# Patient Record
Sex: Female | Born: 1965 | Race: Black or African American | Hispanic: No | Smoking: Never smoker
Health system: Southern US, Community
[De-identification: ages and names within clinical notes are randomized; demographics above are authoritative.]

## PROBLEM LIST (undated history)

## (undated) DIAGNOSIS — N760 Acute vaginitis: Secondary | ICD-10-CM

## (undated) DIAGNOSIS — N83209 Unspecified ovarian cyst, unspecified side: Secondary | ICD-10-CM

## (undated) DIAGNOSIS — E611 Iron deficiency: Secondary | ICD-10-CM

## (undated) DIAGNOSIS — K219 Gastro-esophageal reflux disease without esophagitis: Secondary | ICD-10-CM

## (undated) DIAGNOSIS — B009 Herpesviral infection, unspecified: Secondary | ICD-10-CM

## (undated) DIAGNOSIS — I8390 Asymptomatic varicose veins of unspecified lower extremity: Secondary | ICD-10-CM

## (undated) DIAGNOSIS — B977 Papillomavirus as the cause of diseases classified elsewhere: Secondary | ICD-10-CM

## (undated) HISTORY — DX: Asymptomatic varicose veins of unspecified lower extremity: I83.90

## (undated) HISTORY — DX: Unspecified ovarian cyst, unspecified side: N83.209

## (undated) HISTORY — DX: Acute vaginitis: N76.0

## (undated) HISTORY — DX: Gastro-esophageal reflux disease without esophagitis: K21.9

## (undated) HISTORY — DX: Iron deficiency: E61.1

## (undated) HISTORY — PX: DILATION AND CURETTAGE OF UTERUS: SHX78

## (undated) HISTORY — DX: Papillomavirus as the cause of diseases classified elsewhere: B97.7

## (undated) HISTORY — DX: Herpesviral infection, unspecified: B00.9

---

## 1998-02-21 ENCOUNTER — Other Ambulatory Visit: Admission: RE | Admit: 1998-02-21 | Discharge: 1998-02-21 | Payer: Self-pay | Admitting: Obstetrics & Gynecology

## 1998-02-21 ENCOUNTER — Encounter: Admission: RE | Admit: 1998-02-21 | Discharge: 1998-02-21 | Payer: Self-pay | Admitting: Obstetrics & Gynecology

## 1998-02-28 ENCOUNTER — Encounter: Admission: RE | Admit: 1998-02-28 | Discharge: 1998-02-28 | Payer: Self-pay | Admitting: Obstetrics & Gynecology

## 1998-03-06 ENCOUNTER — Ambulatory Visit (HOSPITAL_COMMUNITY): Admission: RE | Admit: 1998-03-06 | Discharge: 1998-03-06 | Payer: Self-pay | Admitting: Obstetrics & Gynecology

## 1998-04-08 ENCOUNTER — Other Ambulatory Visit: Admission: RE | Admit: 1998-04-08 | Discharge: 1998-04-08 | Payer: Self-pay | Admitting: Obstetrics & Gynecology

## 1998-04-08 ENCOUNTER — Encounter: Admission: RE | Admit: 1998-04-08 | Discharge: 1998-04-08 | Payer: Self-pay | Admitting: Obstetrics & Gynecology

## 1998-04-10 ENCOUNTER — Ambulatory Visit (HOSPITAL_COMMUNITY): Admission: RE | Admit: 1998-04-10 | Discharge: 1998-04-10 | Payer: Self-pay | Admitting: Obstetrics & Gynecology

## 1998-04-18 ENCOUNTER — Inpatient Hospital Stay (HOSPITAL_COMMUNITY): Admission: AD | Admit: 1998-04-18 | Discharge: 1998-04-18 | Payer: Self-pay | Admitting: Obstetrics & Gynecology

## 1998-04-26 ENCOUNTER — Inpatient Hospital Stay (HOSPITAL_COMMUNITY): Admission: AD | Admit: 1998-04-26 | Discharge: 1998-04-26 | Payer: Self-pay | Admitting: *Deleted

## 1998-05-30 ENCOUNTER — Other Ambulatory Visit: Admission: RE | Admit: 1998-05-30 | Discharge: 1998-05-30 | Payer: Self-pay | Admitting: Obstetrics and Gynecology

## 1998-08-20 ENCOUNTER — Inpatient Hospital Stay (HOSPITAL_COMMUNITY): Admission: AD | Admit: 1998-08-20 | Discharge: 1998-08-20 | Payer: Self-pay | Admitting: *Deleted

## 1998-11-12 ENCOUNTER — Inpatient Hospital Stay (HOSPITAL_COMMUNITY): Admission: AD | Admit: 1998-11-12 | Discharge: 1998-11-14 | Payer: Self-pay | Admitting: Obstetrics and Gynecology

## 1999-03-18 ENCOUNTER — Encounter: Admission: RE | Admit: 1999-03-18 | Discharge: 1999-03-18 | Payer: Self-pay | Admitting: Internal Medicine

## 1999-06-01 ENCOUNTER — Inpatient Hospital Stay (HOSPITAL_COMMUNITY): Admission: AD | Admit: 1999-06-01 | Discharge: 1999-06-01 | Payer: Self-pay | Admitting: Obstetrics & Gynecology

## 1999-06-15 ENCOUNTER — Emergency Department (HOSPITAL_COMMUNITY): Admission: EM | Admit: 1999-06-15 | Discharge: 1999-06-16 | Payer: Self-pay | Admitting: Emergency Medicine

## 1999-07-23 ENCOUNTER — Encounter: Admission: RE | Admit: 1999-07-23 | Discharge: 1999-07-23 | Payer: Self-pay | Admitting: Obstetrics

## 1999-12-22 ENCOUNTER — Inpatient Hospital Stay (HOSPITAL_COMMUNITY): Admission: AD | Admit: 1999-12-22 | Discharge: 1999-12-22 | Payer: Self-pay | Admitting: Obstetrics

## 1999-12-24 ENCOUNTER — Ambulatory Visit (HOSPITAL_COMMUNITY): Admission: RE | Admit: 1999-12-24 | Discharge: 1999-12-24 | Payer: Self-pay | Admitting: Internal Medicine

## 1999-12-24 ENCOUNTER — Encounter: Admission: RE | Admit: 1999-12-24 | Discharge: 1999-12-24 | Payer: Self-pay | Admitting: Obstetrics

## 2000-02-01 ENCOUNTER — Ambulatory Visit (HOSPITAL_COMMUNITY): Admission: RE | Admit: 2000-02-01 | Discharge: 2000-02-01 | Payer: Self-pay | Admitting: Obstetrics

## 2000-02-10 ENCOUNTER — Encounter: Admission: RE | Admit: 2000-02-10 | Discharge: 2000-02-10 | Payer: Self-pay | Admitting: Internal Medicine

## 2000-08-23 ENCOUNTER — Encounter: Admission: RE | Admit: 2000-08-23 | Discharge: 2000-08-23 | Payer: Self-pay | Admitting: Hematology and Oncology

## 2000-11-04 ENCOUNTER — Encounter: Admission: RE | Admit: 2000-11-04 | Discharge: 2000-11-04 | Payer: Self-pay | Admitting: Obstetrics & Gynecology

## 2000-11-09 ENCOUNTER — Inpatient Hospital Stay (HOSPITAL_COMMUNITY): Admission: AD | Admit: 2000-11-09 | Discharge: 2000-11-09 | Payer: Self-pay | Admitting: Obstetrics

## 2000-11-09 ENCOUNTER — Encounter: Payer: Self-pay | Admitting: Obstetrics

## 2000-11-16 ENCOUNTER — Encounter: Payer: Self-pay | Admitting: *Deleted

## 2000-11-16 ENCOUNTER — Ambulatory Visit (HOSPITAL_COMMUNITY): Admission: RE | Admit: 2000-11-16 | Discharge: 2000-11-16 | Payer: Self-pay | Admitting: *Deleted

## 2000-11-17 ENCOUNTER — Ambulatory Visit (HOSPITAL_COMMUNITY): Admission: RE | Admit: 2000-11-17 | Discharge: 2000-11-17 | Payer: Self-pay | Admitting: *Deleted

## 2000-11-17 ENCOUNTER — Encounter (INDEPENDENT_AMBULATORY_CARE_PROVIDER_SITE_OTHER): Payer: Self-pay | Admitting: Specialist

## 2000-12-22 ENCOUNTER — Other Ambulatory Visit: Admission: RE | Admit: 2000-12-22 | Discharge: 2000-12-22 | Payer: Self-pay | Admitting: Obstetrics & Gynecology

## 2000-12-23 ENCOUNTER — Encounter: Admission: RE | Admit: 2000-12-23 | Discharge: 2000-12-23 | Payer: Self-pay | Admitting: Obstetrics & Gynecology

## 2001-04-25 ENCOUNTER — Encounter: Admission: RE | Admit: 2001-04-25 | Discharge: 2001-04-25 | Payer: Self-pay | Admitting: Internal Medicine

## 2001-04-25 ENCOUNTER — Encounter: Payer: Self-pay | Admitting: Internal Medicine

## 2001-04-25 ENCOUNTER — Ambulatory Visit (HOSPITAL_COMMUNITY): Admission: RE | Admit: 2001-04-25 | Discharge: 2001-04-25 | Payer: Self-pay | Admitting: Internal Medicine

## 2001-05-30 ENCOUNTER — Encounter: Admission: RE | Admit: 2001-05-30 | Discharge: 2001-05-30 | Payer: Self-pay | Admitting: Obstetrics & Gynecology

## 2001-06-02 ENCOUNTER — Encounter: Admission: RE | Admit: 2001-06-02 | Discharge: 2001-06-02 | Payer: Self-pay | Admitting: Obstetrics & Gynecology

## 2001-06-02 ENCOUNTER — Encounter: Payer: Self-pay | Admitting: Obstetrics & Gynecology

## 2001-06-30 ENCOUNTER — Encounter: Admission: RE | Admit: 2001-06-30 | Discharge: 2001-06-30 | Payer: Self-pay | Admitting: Internal Medicine

## 2001-07-01 ENCOUNTER — Encounter: Payer: Self-pay | Admitting: *Deleted

## 2001-07-01 ENCOUNTER — Inpatient Hospital Stay (HOSPITAL_COMMUNITY): Admission: AD | Admit: 2001-07-01 | Discharge: 2001-07-01 | Payer: Self-pay | Admitting: *Deleted

## 2001-08-01 ENCOUNTER — Encounter: Admission: RE | Admit: 2001-08-01 | Discharge: 2001-08-01 | Payer: Self-pay | Admitting: Obstetrics & Gynecology

## 2001-12-22 ENCOUNTER — Encounter: Admission: RE | Admit: 2001-12-22 | Discharge: 2001-12-22 | Payer: Self-pay | Admitting: Internal Medicine

## 2002-01-18 ENCOUNTER — Encounter (INDEPENDENT_AMBULATORY_CARE_PROVIDER_SITE_OTHER): Payer: Self-pay | Admitting: Specialist

## 2002-01-18 ENCOUNTER — Encounter: Admission: RE | Admit: 2002-01-18 | Discharge: 2002-01-18 | Payer: Self-pay | Admitting: *Deleted

## 2002-02-28 ENCOUNTER — Other Ambulatory Visit: Admission: RE | Admit: 2002-02-28 | Discharge: 2002-02-28 | Payer: Self-pay | Admitting: Obstetrics and Gynecology

## 2002-03-30 ENCOUNTER — Other Ambulatory Visit: Admission: RE | Admit: 2002-03-30 | Discharge: 2002-03-30 | Payer: Self-pay | Admitting: Obstetrics and Gynecology

## 2002-07-17 ENCOUNTER — Other Ambulatory Visit: Admission: RE | Admit: 2002-07-17 | Discharge: 2002-07-17 | Payer: Self-pay | Admitting: Obstetrics and Gynecology

## 2003-01-21 ENCOUNTER — Other Ambulatory Visit: Admission: RE | Admit: 2003-01-21 | Discharge: 2003-01-21 | Payer: Self-pay | Admitting: Obstetrics and Gynecology

## 2004-02-25 ENCOUNTER — Other Ambulatory Visit: Admission: RE | Admit: 2004-02-25 | Discharge: 2004-02-25 | Payer: Self-pay | Admitting: Obstetrics and Gynecology

## 2004-05-28 ENCOUNTER — Ambulatory Visit (HOSPITAL_COMMUNITY): Admission: RE | Admit: 2004-05-28 | Discharge: 2004-05-28 | Payer: Self-pay | Admitting: Obstetrics and Gynecology

## 2004-08-18 ENCOUNTER — Emergency Department (HOSPITAL_COMMUNITY): Admission: EM | Admit: 2004-08-18 | Discharge: 2004-08-18 | Payer: Self-pay | Admitting: Family Medicine

## 2004-10-21 ENCOUNTER — Inpatient Hospital Stay (HOSPITAL_COMMUNITY): Admission: AD | Admit: 2004-10-21 | Discharge: 2004-10-25 | Payer: Self-pay | Admitting: Obstetrics and Gynecology

## 2004-10-22 ENCOUNTER — Encounter (INDEPENDENT_AMBULATORY_CARE_PROVIDER_SITE_OTHER): Payer: Self-pay | Admitting: *Deleted

## 2006-11-29 ENCOUNTER — Encounter: Admission: RE | Admit: 2006-11-29 | Discharge: 2006-11-29 | Payer: Self-pay | Admitting: Obstetrics and Gynecology

## 2007-07-18 ENCOUNTER — Emergency Department (HOSPITAL_COMMUNITY): Admission: EM | Admit: 2007-07-18 | Discharge: 2007-07-18 | Payer: Self-pay | Admitting: Emergency Medicine

## 2008-08-01 ENCOUNTER — Encounter: Admission: RE | Admit: 2008-08-01 | Discharge: 2008-08-01 | Payer: Self-pay | Admitting: Obstetrics and Gynecology

## 2009-08-04 ENCOUNTER — Encounter: Admission: RE | Admit: 2009-08-04 | Discharge: 2009-08-04 | Payer: Self-pay | Admitting: Obstetrics and Gynecology

## 2009-09-24 ENCOUNTER — Ambulatory Visit: Payer: Self-pay | Admitting: Family Medicine

## 2009-09-24 DIAGNOSIS — M545 Low back pain, unspecified: Secondary | ICD-10-CM | POA: Insufficient documentation

## 2009-09-24 DIAGNOSIS — M79609 Pain in unspecified limb: Secondary | ICD-10-CM

## 2009-09-24 DIAGNOSIS — Z9189 Other specified personal risk factors, not elsewhere classified: Secondary | ICD-10-CM

## 2009-09-24 DIAGNOSIS — J45909 Unspecified asthma, uncomplicated: Secondary | ICD-10-CM

## 2009-09-24 DIAGNOSIS — R635 Abnormal weight gain: Secondary | ICD-10-CM

## 2009-09-26 ENCOUNTER — Encounter: Payer: Self-pay | Admitting: Family Medicine

## 2009-09-26 ENCOUNTER — Ambulatory Visit: Payer: Self-pay | Admitting: Family Medicine

## 2009-09-26 LAB — CONVERTED CEMR LAB
CO2: 25 meq/L (ref 19–32)
Calcium: 9.9 mg/dL (ref 8.4–10.5)
Glucose, Bld: 98 mg/dL (ref 70–99)
HCT: 38.8 % (ref 36.0–46.0)
MCV: 81 fL (ref 78.0–100.0)
RBC: 4.79 M/uL (ref 3.87–5.11)
Sodium: 138 meq/L (ref 135–145)
TSH: 2.158 microintl units/mL (ref 0.350–4.500)
Total CHOL/HDL Ratio: 4.8
WBC: 5.8 10*3/uL (ref 4.0–10.5)

## 2009-09-30 ENCOUNTER — Encounter: Payer: Self-pay | Admitting: Family Medicine

## 2009-10-07 ENCOUNTER — Telehealth: Payer: Self-pay | Admitting: *Deleted

## 2009-11-12 ENCOUNTER — Ambulatory Visit: Payer: Self-pay | Admitting: Family Medicine

## 2009-11-12 DIAGNOSIS — F4323 Adjustment disorder with mixed anxiety and depressed mood: Secondary | ICD-10-CM

## 2009-11-28 ENCOUNTER — Ambulatory Visit: Payer: Self-pay | Admitting: Family Medicine

## 2009-11-28 DIAGNOSIS — M25559 Pain in unspecified hip: Secondary | ICD-10-CM

## 2009-12-02 ENCOUNTER — Encounter: Admission: RE | Admit: 2009-12-02 | Discharge: 2009-12-02 | Payer: Self-pay | Admitting: Family Medicine

## 2009-12-04 ENCOUNTER — Telehealth: Payer: Self-pay | Admitting: Family Medicine

## 2009-12-04 ENCOUNTER — Encounter: Payer: Self-pay | Admitting: Family Medicine

## 2010-01-09 ENCOUNTER — Ambulatory Visit: Payer: Self-pay | Admitting: Family Medicine

## 2010-01-09 DIAGNOSIS — L680 Hirsutism: Secondary | ICD-10-CM

## 2010-01-09 DIAGNOSIS — N926 Irregular menstruation, unspecified: Secondary | ICD-10-CM | POA: Insufficient documentation

## 2010-01-09 DIAGNOSIS — M722 Plantar fascial fibromatosis: Secondary | ICD-10-CM | POA: Insufficient documentation

## 2010-01-09 LAB — CONVERTED CEMR LAB
ALT: 25 units/L (ref 0–35)
AST: 18 units/L (ref 0–37)
Alkaline Phosphatase: 89 units/L (ref 39–117)
Calcium: 9.1 mg/dL (ref 8.4–10.5)
Chloride: 103 meq/L (ref 96–112)
Creatinine, Ser: 0.86 mg/dL (ref 0.40–1.20)
Preg, Serum: NEGATIVE
Total Bilirubin: 0.2 mg/dL — ABNORMAL LOW (ref 0.3–1.2)

## 2010-01-14 ENCOUNTER — Encounter: Payer: Self-pay | Admitting: Family Medicine

## 2010-07-09 ENCOUNTER — Telehealth: Payer: Self-pay | Admitting: *Deleted

## 2010-09-04 ENCOUNTER — Encounter: Admission: RE | Admit: 2010-09-04 | Discharge: 2010-09-04 | Payer: Self-pay | Admitting: Obstetrics and Gynecology

## 2010-09-04 ENCOUNTER — Emergency Department (HOSPITAL_COMMUNITY)
Admission: EM | Admit: 2010-09-04 | Discharge: 2010-09-04 | Payer: Self-pay | Source: Home / Self Care | Admitting: Family Medicine

## 2010-11-01 ENCOUNTER — Encounter: Payer: Self-pay | Admitting: Obstetrics and Gynecology

## 2010-11-12 NOTE — Progress Notes (Signed)
  Phone Note Outgoing Call   Call placed by: Paula Compton MD,  December 04, 2009 3:35 PM Call placed to: Patient Summary of Call: Called patient, left voice mail message.  I wish to report negative x-rays of hips and lower back.  Left voice mail for patient to call me.  Will send letter with instructions for pyriformis exercises that I wish for her to do. Initial call taken by: Paula Compton MD,  December 04, 2009 3:37 PM

## 2010-11-12 NOTE — Letter (Signed)
Summary: Generic Letter  [facility]  [address]   [place], [place] [address]   Phone: [phone]  Fax: [phone]    12/04/2009  AZLEE MONFORTE [address] [place], [place]  [address]  Dear [name],    I hope this letter finds you well.  I write with good news, the x-rays of your hips and low back are normal.  I would like for you to try an exercise/stretching program to see if it helps the right-sided hip pain.  I am enclosing a sheet of paper that gives details about the way to do the hip exercises.       Sincerely,   Paula Compton MD  Appended Document: Generic Letter mailed with exercise instructions.

## 2010-11-12 NOTE — Assessment & Plan Note (Signed)
Summary: f/u tcb   Vital Signs:  Patient profile:   45 year old female Height:      66.0 inches Weight:      195 pounds Temp:     98.5 degrees F oral Pulse rate:   96 / minute BP sitting:   112 / 70  (left arm) Cuff size:   regular  Vitals Entered By: Tessie Fass CMA (November 28, 2009 2:00 PM) CC: F/U meds Is Patient Diabetic? No Pain Assessment Patient in pain? yes     Location: hip Intensity: 10   CC:  F/U meds.  History of Present Illness: Emma Rhodes returns today for assessment of R leg/hip pain, also her depressive sxs. She reports that her R foot pain resolved with the prednisone burst from last time.  She continues with R hip pain, some less intense pain on the L hip, that is present when she lays on the hip in bed. Able to walk.  No weakness or falls, no incontinence. Naproxen does not help this pain.   Has had the pain for a number of years, had evaluatiokn of this in the past (she recalls 1997 as a time when she might have had this evaluated).  Regarding the depressive sxs, she feels much better on the citalopram 20mg  daily. Doing well without side effects. Dealing with her mother's chronic illness well, not reacting negatively to mother's commentary.  Habits & Providers  Alcohol-Tobacco-Diet     Tobacco Status: never  Current Medications (verified): 1)  Advair Diskus 250-50 Mcg/dose Aepb (Fluticasone-Salmeterol) .... Sig: Use 1 Inhalation Every 12 Hours Disp Quantity Sufficient 3 Months 2)  Nexium 40 Mg Cpdr (Esomeprazole Magnesium) .... Sig Take 1 By Mouth Once Daily Disp Quant Sufficient 3 Months 3)  Naproxen 500 Mg Tabs (Naproxen) .... Sig: Take 1 Tab By Mouth Two Times A Day As Needed For Pain 4)  Citalopram Hydrobromide 20 Mg Tabs (Citalopram Hydrobromide) .... Sig: Take 1 Tab By Mouth One Time Daily 5)  Lorazepam 1 Mg Tabs (Lorazepam) .... Sig Take 1/2 To 1 Tab By Mouth Every 12 Hours As Needed For Anxiety  Allergies (verified): No Known Drug  Allergies  Family History: Reviewed history from 09/24/2009 and no changes required. Mother (aged 31) living and with breast cancer, HTN.  Father with HTN and heart disease.  Denies any family hx of CVA, colon cancer, sudden death.   Social History: Reviewed history from 11/12/2009 and no changes required. Origianlly from [place].  Never a smoker.  Not working currently.  Lives with her husband Glade Lloyd, daughters Izetta Dakin DOB [birth year]1), Shahad (DOB 02/01/94); sons (Ahmed DOB 11/12/1998), Mohamed (DOB 09/10/05), and her mother Barnet Glasgow.  Nov 12, 2009: Mother with breast cancer, living in patient's house for treatments then returning to [place].  Review of Systems       Gained 50lb in the past 5 yrs since her last pregnancy.    Physical Exam  General:  well appearing, Bright-affect.  No apparent distress.  Neck:  No deformities, masses, or tenderness noted. Lungs:  Normal respiratory effort, chest expands symmetrically. Lungs are clear to auscultation, no crackles or wheezes. Heart:  Normal rate and regular rhythm. S1 and S2 normal without gallop, murmur, click, rub or other extra sounds. Msk:  Pain to palpate along the  R sciatic notch/ SI joint.  No pain over greater trochanter bilat.  Straight leg raise (sitting) is negative today.   Able to int/ext rotate hips bilat without limitation (active rotation) Pulses:  Palpable  dp pulses bilat feet Neurologic:  Able to ambulate independently and bear weight without apparent distress   Impression & Recommendations:  Problem # 1:  LOW BACK PAIN, CHRONIC (ICD-724.2) Improvement in the R foot pain with prednisone burst.  Continues with pain along the R hip, lateral R thigh.  Pain over sciatic notch suggestive of sciatica.  Chronic pain; will send for films, to consider ESR or CRP if not better.  Consider CT scan without contrast after plain films.  Discussed weight gain and its role in back pain.   Her updated medication list for this problem includes:     Naproxen 500 Mg Tabs (Naproxen) ..... Sig: take 1 tab by mouth two times a day as needed for pain  Orders: Diagnostic X-Ray/Fluoroscopy (Diagnostic X-Ray/Flu)  Problem # 2:  PAIN IN JOINT PELVIC REGION AND THIGH (ICD-719.45) See Assessment #1; hip films along with LS spine films.   Her updated medication list for this problem includes:    Naproxen 500 Mg Tabs (Naproxen) ..... Sig: take 1 tab by mouth two times a day as needed for pain  Orders: Diagnostic X-Ray/Fluoroscopy (Diagnostic X-Ray/Flu) [facility]- Est  Level 4 ([address])  Problem # 3:  ADJ DISORDER WITH MIXED ANXIETY & DEPRESSED MOOD (ICD-309.28)  Patient doing much better with depressive sxs, able to handle her mother's chronic illness and behaviour better.  Continue citalopram 20mg  daily, consider increasing dose in future if needed.  PHQ-9 score today is a 13, without passive or active SI on #9.  Orders: [facility]- Est  Level 4 ([address])  Problem # 4:  WEIGHT GAIN, ABNORMAL (ICD-783.1) Discussed weight gain role in back and hip pain. Consider physical therapy after assessment of back pain. Exercise regimen for weightr loss and core strengthening.  Orders: [facility]- Est  Level 4 ([address])  Complete Medication List: 1)  Advair Diskus 250-50 Mcg/dose Aepb (Fluticasone-salmeterol) .... Sig: use 1 inhalation every 12 hours disp quantity sufficient 3 months 2)  Nexium 40 Mg Cpdr (Esomeprazole magnesium) .... Sig take 1 by mouth once daily disp quant sufficient 3 months 3)  Naproxen 500 Mg Tabs (Naproxen) .... Sig: take 1 tab by mouth two times a day as needed for pain 4)  Citalopram Hydrobromide 20 Mg Tabs (Citalopram hydrobromide) .... Sig: take 1 tab by mouth one time daily 5)  Lorazepam 1 Mg Tabs (Lorazepam) .... Sig take 1/2 to 1 tab by mouth every 12 hours as needed for anxiety  Patient Instructions: 1)  It was good to see you today; I am glad you are feeling better overall. 2)  Please keep taking the Citalopram 20mg  one time every day.   3)   The lorazepam should only be taken if you are very anxious; I do not want you to take this medicine as a regular, everyday medicine. 4)  The prednisone we started last time should have ended. Do not take any more if you have it left over.  5)  I am sending you for xrays of the hips and low back; I would like to see you here in 1 month for a visit.

## 2010-11-12 NOTE — Progress Notes (Signed)
Summary: appt tomorrow/ts  Phone Note Call from Patient Call back at Home Phone [phone]   Caller: Patient Summary of Call: Pt feeling dizzy. Initial call taken by: Clydell Hakim,  July 09, 2010 3:41 PM  Follow-up for Phone Call        called pt. feels dizzy and sometimes nausea, when she gets really busy. had leakage from right ear yesterday. advised pt to come into clinic tomorrow at 9:30am to be worked in. that's the only time pt can come in. appt scheduled. Follow-up by: Arlyss Repress CMA,,  July 09, 2010 3:48 PM

## 2010-11-12 NOTE — Letter (Signed)
Summary: Generic Letter  [facility]  [address]   [place], [place] [address]   Phone: [phone]  Fax: [phone]    01/14/2010  Emma Rhodes [address] [place], [place]  [address]  Dear [name],   I hope this letter finds you well.  I write with good news about your recent lab studies done in our office.   All the lab tests we ran at your last visit came back normal.    I look forward to seeing you at your next visit.     Sincerely,   Paula Compton MD  Appended Document: Generic Letter mailed.

## 2010-11-12 NOTE — Assessment & Plan Note (Signed)
Summary: f/u meds/eo   Vital Signs:  Patient profile:   45 year old female Height:      66.0 inches Weight:      190 pounds BMI:     30.78 Temp:     98.2 degrees F oral Pulse rate:   85 / minute BP sitting:   114 / 79  (right arm) Cuff size:   regular  Vitals Entered By: Emma Rhodes CMA (January 09, 2010 2:55 PM) CC: recheck right foot Is Patient Diabetic? No Pain Assessment Patient in pain? yes     Location: head Intensity: 9   CC:  recheck right foot.  History of Present Illness: Emma Rhodes comes in today accompanied by a cousin.  She complains of persistent pain in her R heel, made better by foot massages only.  She recently bought an insert for her shoes, makes it mildly better.  The R hip/low back pain is somewhat better, relatively unchanged.  She admits she did not do the exercises included with the letter I sent her after her hip x-rays.   She complains of 3 days of feeling feverish and chilled, body aches.  Fever to 102F last night.  Has had mild cough.  Ears and throat itch.  No nausea/vomiting, no diarrhea.  Her cousin had been sick with stomach symptoms recently, but Emma Rhodes has not developed these.  No dyspnea.  Does suffer from seasonal allergies in the Springtime.   Complains about longstanding hirsutism, for which she plucks her chin.  Has been a longstanding source of embarrassment for her.  In the past, she was on OCP's but these were discontinued in 2006 in favor of Mirena IUD>  She had the IUD removed 2 months ago.  Not using any hormonal birth control at this time.  Has been told she had 'ovarian cyst' in the past, which ruptured.  In reviewing her labwork from our office in December 2010, no elevated fasting glucose, no abnormal TSH at that time.   Her LMP was 01/02/10, she reports having very irregular menses ever since menarche (often come more than once in a month).   Depression well controlled on Citalopram 20mg  daily.  She does not see a need for increased dose.  Mother is not dealing well with her own illness.  Emma Rhodes is holding things together in the family.  Intentionally lost 5 lbs in 5 weeks, is very proud of this.   Habits & Providers  Alcohol-Tobacco-Diet     Tobacco Status: never  Current Medications (verified): 1)  Advair Diskus 250-50 Mcg/dose Aepb (Fluticasone-Salmeterol) .... Sig: Use 1 Inhalation Every 12 Hours Disp Quantity Sufficient 3 Months 2)  Nexium 40 Mg Cpdr (Esomeprazole Magnesium) .... Sig Take 1 By Mouth Once Daily Disp Quant Sufficient 3 Months 3)  Naproxen 500 Mg Tabs (Naproxen) .... Sig: Take 1 Tab By Mouth Two Times A Day As Needed For Pain 4)  Citalopram Hydrobromide 20 Mg Tabs (Citalopram Hydrobromide) .... Sig: Take 1 Tab By Mouth One Time Daily 5)  Lorazepam 1 Mg Tabs (Lorazepam) .... Sig Take 1/2 To 1 Tab By Mouth Every 12 Hours As Needed For Anxiety  Allergies (verified): No Known Drug Allergies  Family History: Reviewed history from 09/24/2009 and no changes required. Mother (aged 67) living and with breast cancer, HTN.  Father with HTN and heart disease.  Denies any family hx of CVA, colon cancer, sudden death.   Social History: Reviewed history from 11/12/2009 and no changes required. Origianlly from [place].  Never a smoker.  Not working currently.  Lives with her husband Emma Rhodes, daughters Emma Rhodes DOB [id number]), Emma Rhodes (DOB 02/01/94); sons (Emma Rhodes DOB 11/12/1998), Emma Rhodes (DOB 09/10/05), and her mother Emma Rhodes.  Nov 12, 2009: Mother with breast cancer, living in patient's house for treatments then returning to [place].  Physical Exam  General:  Generally well appearing, no apparent distress Eyes:  Clear sclerae; injected conjunctivae Ears:  External ear exam shows no significant lesions or deformities.  Otoscopic examination reveals clear canals, tympanic membranes are intact bilaterally without bulging, retraction, inflammation or discharge. Hearing is grossly normal bilaterally. Nose:  Boggy nasal mucosa, wiht  bluish hue.  Mouth:  Oral mucosa and oropharynx without lesions or exudates.  Teeth in good repair. Some cobblestoning Neck:  No deformities, masses, or tenderness noted. Lungs:  Normal respiratory effort, chest expands symmetrically. Lungs are clear to auscultation, no crackles or wheezes.   Impression & Recommendations:  Problem # 1:  PLANTAR FASCIITIS, RIGHT (ICD-728.71)  Discussed heel massage with soup can in the morning, as well as heel cup and supportive shoes.   Her updated medication list for this problem includes:    Naproxen 500 Mg Tabs (Naproxen) ..... Sig: take 1 tab by mouth two times a day as needed for pain  Orders: [facility]- Est  Level 4 ([address])  Problem # 2:  HIRSUTISM (ICD-704.1)  Hirsutism, in obese female at risk for PCOS.  No IFG on December labs.  Will assess for hyperandrogenism, as well as A1c and prolactinoma wiht today's labs.  UPreg for menstrual irregularities.  Orders: [facility]- Est  Level 4 ([address])  Problem # 3:  ADJ DISORDER WITH MIXED ANXIETY & DEPRESSED MOOD (ICD-309.28)  Doing well with current medication regimen.  Not considering changing until at least 6 to 12 months on treatment. Lengthy discussion today about plan for management.   Orders: [facility]- Est  Level 4 ([address])  Complete Medication List: 1)  Advair Diskus 250-50 Mcg/dose Aepb (Fluticasone-salmeterol) .... Sig: use 1 inhalation every 12 hours disp quantity sufficient 3 months 2)  Nexium 40 Mg Cpdr (Esomeprazole magnesium) .... Sig take 1 by mouth once daily disp quant sufficient 3 months 3)  Naproxen 500 Mg Tabs (Naproxen) .... Sig: take 1 tab by mouth two times a day as needed for pain 4)  Citalopram Hydrobromide 20 Mg Tabs (Citalopram hydrobromide) .... Sig: take 1 tab by mouth one time daily 5)  Lorazepam 1 Mg Tabs (Lorazepam) .... Sig take 1/2 to 1 tab by mouth every 12 hours as needed for anxiety  Other Orders: Comp Met-[facility] [id number]) Testosterone-[facility] [id number]) FSH-[facility]  [id number]) Prolactin-[facility] [phone])  Patient Instructions: 1)  It was a pleasure to see you today.  2)  For the cold symptoms, I recommend measures to help you feel better.  It is caused by a virus, and it will pass on its own.  3)  I recommend that you take ibuprofen 200mg  tablets, take 2 to 4 tablets every 6 to 8 hours with something to eat.  It will help with body aches and fevers.  Do not take this way for more than 7 days. 4)  For your right foot, I recommend you to wear supportive tennis shoes and use a heel cup in the right heel (available in the sporting goods store).   Appended Document: Orders Update    Clinical Lists Changes  Orders: Added new Test order of Miscellaneous Lab Charge-[facility] [id number]) - Signed

## 2010-11-12 NOTE — Assessment & Plan Note (Signed)
Summary: foot pain,tcb   Vital Signs:  Patient profile:   45 year old female Height:      66.0 inches Weight:      195.3 pounds BMI:     31.64 Temp:     98.1 degrees F oral Pulse rate:   85 / minute BP sitting:   122 / 81  (left arm) Cuff size:   regular  Vitals Entered By: Gladstone Pih (November 12, 2009 9:01 AM) CC: right foot pain Valerie Salts Is Patient Diabetic? No Pain Assessment Patient in pain? yes     Location: foot Intensity: 9 Type: sharp   CC:  right foot pain X6years.  History of Present Illness: Patient here for follow up of R sided sciatica pain, which is unchanged from the previous visit.  Has been dealing wit hthis for several years; had chiropractic treatments 11 yrs ago with mild temporary relief.  No weakness or falls, no trauma.  Alleve gives mild relief.   Also disturbed and anxious about her mother's presence in her house. Mother diagnosed with breast cancer and living with patient while getting treatment.  Mother yells at patient and her children, is very harsh and says mean things( "you look fat").  Patient's children are not responding well to their grandmother's temper and attitude.  Patient gets very anxious some days with this conflict.  Feels down, denies any SI or HI.  Has been diagnosed wtih depression in the past , in 2003 with a miscarriage and when she was experiencing tensions in her marriage.  Was on Prozac prescribed by Dr Stefano Gaul her GYN, but she did not think this helped. Her main social support is her husband, who gives her time to get out of the house.  She states he is more patient with his mother-in-law than the patient is.     Habits & Providers  Alcohol-Tobacco-Diet     Tobacco Status: never  Current Medications (verified): 1)  Advair Diskus 250-50 Mcg/dose Aepb (Fluticasone-Salmeterol) .... Sig: Use 1 Inhalation Every 12 Hours Disp Quantity Sufficient 3 Months 2)  Advair Diskus 250-50 Mcg/dose Aepb (Fluticasone-Salmeterol) ....  Sig: Use 1 Inhalation Every 12 Hours 3)  Nexium 40 Mg Cpdr (Esomeprazole Magnesium) .... Sig Take 1 By Mouth Once Daily Disp Quant Sufficient 3 Months 4)  Naproxen 500 Mg Tabs (Naproxen) .... Sig: Take 1 Tab By Mouth Two Times A Day As Needed For Pain 5)  Citalopram Hydrobromide 20 Mg Tabs (Citalopram Hydrobromide) .... Sig: Take 1 Tab By Mouth One Time Daily 6)  Lorazepam 1 Mg Tabs (Lorazepam) .... Sig Take 1/2 To 1 Tab By Mouth Every 12 Hours As Needed For Anxiety 7)  Prednisone 20 Mg Tabs (Prednisone) .... Take 2 Tab By Mouth One Time Daily For Seven Days  Allergies (verified): No Known Drug Allergies  Family History: Reviewed history from 09/24/2009 and no changes required. Mother (aged 66) living and with breast cancer, HTN.  Father with HTN and heart disease.  Denies any family hx of CVA, colon cancer, sudden death.   Social History: Reviewed history from 09/24/2009 and no changes required. Origianlly from [place].  Never a smoker.  Not working currently.  Lives with her husband Glade Lloyd, daughters Izetta Dakin DOB 1610), Shahad (DOB 02/01/94); sons (Ahmed DOB 11/12/1998), Mohamed (DOB 09/10/05), and her mother Barnet Glasgow.  Nov 12, 2009: Mother with breast cancer, living in patient's house for treatments then returning to [place].  Physical Exam  General:  Well appearing, slightly flattened affect.  No apparent  distress.  Msk:  Mild pain to palpate R sciatic notch.  Sitting SLR is positive.  Full ROM passively in R hip, R knee, R ankle.  No pain with palpation over Achilles tendon, plantar fascia, or medial/lateral malleoli on R foot. No skin changes.  Pulses:  palpable dp pulses bilat   Impression & Recommendations:  Problem # 1:  FOOT PAIN, RIGHT (ICD-729.5)  Patient with continued (unchanged) R foot, low back pain that is worse when she gets up and walks, gets mildly better with use.  Has had extensive workup elsewhere, including MRI lumbar spine.  She has been diagnosed with "sciatica".   Sitting SLR positive today.  Will give a short trial of prednisone to see if improvement; consider PT/OT referral for further treatment.  In the past, chiropractic manipulation (11 yrs ago) was useful for brief periods.  Orders: [facility]- Est Level  3 ([address])  Problem # 2:  ADJ DISORDER WITH MIXED ANXIETY & DEPRESSED MOOD (ICD-309.28)  Patient with heightened anxiety and depressed mood precipitated by her mother's presence in her house, mother's treatment for breast cancer, mother's negative treatment toward patient and her children.  Has been tried on Prozac in 2003 after a miscarriage and during some marital difficulties, she didn't think i worked.  Was interested in Effexor or other similar med.  Will try Citalopram on the $4 list.  Also, shortterm use of lorazepam for worsening anxiety Discussed sedative and potential for habituation, plan for short-term use until Citalopram effects established. For follow up in 2 to 4 weeks.   Orders: [facility]- Est Level  3 ([address])  Complete Medication List: 1)  Advair Diskus 250-50 Mcg/dose Aepb (Fluticasone-salmeterol) .... Sig: use 1 inhalation every 12 hours disp quantity sufficient 3 months 2)  Advair Diskus 250-50 Mcg/dose Aepb (Fluticasone-salmeterol) .... Sig: use 1 inhalation every 12 hours 3)  Nexium 40 Mg Cpdr (Esomeprazole magnesium) .... Sig take 1 by mouth once daily disp quant sufficient 3 months 4)  Naproxen 500 Mg Tabs (Naproxen) .... Sig: take 1 tab by mouth two times a day as needed for pain 5)  Citalopram Hydrobromide 20 Mg Tabs (Citalopram hydrobromide) .... Sig: take 1 tab by mouth one time daily 6)  Lorazepam 1 Mg Tabs (Lorazepam) .... Sig take 1/2 to 1 tab by mouth every 12 hours as needed for anxiety 7)  Prednisone 20 Mg Tabs (Prednisone) .... Take 2 tab by mouth one time daily for seven days  Patient Instructions: 1)  It was a pleasure to see you today. 2)  I am prescribing you two medications for the anxiety.   The CITALOPRAM 20mg  is to  be taken EVERY DAY.  It may take 2 to 4 weeks to be maximally effective. 3)  The LORAZEPAM 1mg  tab, is to be taken 1/2 to 1 tablet ONLY AS NEEDED for strong anxiety.  It may make you sleepy. I expect you will need this less after you have been on the Citalopram for a couple of weeks. 4)  Lastly, I am prescribing a trial of prednisone 20mg  tablets, take 2 tablets (40mg ) one time daily for one week only.  If not better, I would like to consider a physical therapy consult at [facility]. 5)  I WOULD LIKE TO SEE YOU BACK IN THE OFFICE IN 2 TO 4 WEEKS TO RE-EVALUATE. Prescriptions: PREDNISONE 20 MG TABS (PREDNISONE) Take 2 tab by mouth one time daily for seven days  #14 x 0   Entered and Authorized by:  Paula Compton MD   Signed by:   Paula Compton MD on 11/12/2009   Method used:   Printed then faxed to ...       [facility] [address] [phone]* (retail)       [address]       [place], [place]  [address]       Ph: [phone]       Fax: [phone]   RxID:   [id number] LORAZEPAM 1 MG TABS (LORAZEPAM) SIG Take 1/2 to 1 tab by mouth every 12 hours as needed for anxiety  #30 x 3   Entered and Authorized by:   Paula Compton MD   Signed by:   Paula Compton MD on 11/12/2009   Method used:   Printed then faxed to ...       [facility] [address] [phone]* (retail)       [address]       [place], [place]  [address]       Ph: [phone]       Fax: [phone]   RxID:   [id number] CITALOPRAM HYDROBROMIDE 20 MG TABS (CITALOPRAM HYDROBROMIDE) SIG: Take 1 tab by mouth one time daily  #30 x 11   Entered and Authorized by:   Paula Compton MD   Signed by:   Paula Compton MD on 11/12/2009   Method used:   Electronically to        [facility] [phone]* (retail)       [address]       [place], [place]  [address]       Ph: [phone]       Fax: [phone]   RxID:   [id number]

## 2010-11-25 ENCOUNTER — Encounter: Payer: Self-pay | Admitting: *Deleted

## 2010-12-30 ENCOUNTER — Ambulatory Visit: Payer: Self-pay

## 2010-12-30 ENCOUNTER — Inpatient Hospital Stay (INDEPENDENT_AMBULATORY_CARE_PROVIDER_SITE_OTHER)
Admission: RE | Admit: 2010-12-30 | Discharge: 2010-12-30 | Disposition: A | Discharge status: Home / Self Care | Payer: Self-pay | Source: Ambulatory Visit | Attending: Family Medicine | Admitting: Family Medicine

## 2010-12-30 DIAGNOSIS — M545 Low back pain, unspecified: Secondary | ICD-10-CM

## 2010-12-30 DIAGNOSIS — N390 Urinary tract infection, site not specified: Secondary | ICD-10-CM

## 2010-12-30 LAB — POCT URINALYSIS DIP (DEVICE)
Bilirubin Urine: NEGATIVE
Glucose, UA: NEGATIVE mg/dL
Ketones, ur: NEGATIVE mg/dL
Specific Gravity, Urine: 1.025 (ref 1.005–1.030)

## 2010-12-30 LAB — POCT PREGNANCY, URINE: Preg Test, Ur: NEGATIVE

## 2011-01-01 ENCOUNTER — Other Ambulatory Visit: Payer: Self-pay | Admitting: Family Medicine

## 2011-01-01 NOTE — Telephone Encounter (Signed)
Printed Rx to leave at front desk or fax at pt's request. Thanks, JB

## 2011-01-01 NOTE — Telephone Encounter (Signed)
Refill request

## 2011-01-01 NOTE — Telephone Encounter (Signed)
Not sure what I need to do with this.   Thanks, [employer]

## 2011-01-05 ENCOUNTER — Ambulatory Visit: Payer: Self-pay | Admitting: Family Medicine

## 2011-01-05 ENCOUNTER — Telehealth: Payer: Self-pay | Admitting: *Deleted

## 2011-01-05 MED ORDER — LORAZEPAM 1 MG PO TABS: 1.0000 mg | ORAL_TABLET | Freq: Two times a day (BID) | ORAL | Status: DC | PRN

## 2011-01-05 NOTE — Telephone Encounter (Signed)
Emma Rhodes Has not been seen her for many months--unclear to me if she was supposed to have closer f/u--I would give her 2 weeks of lorazepam as below

## 2011-01-05 NOTE — Telephone Encounter (Signed)
Patient calls requesting refill on lorazapam states the pharmacy has been faxing to [place] for 3 days but we have not received.  Will forward to MD.  [facility] [address]

## 2011-01-06 NOTE — Telephone Encounter (Signed)
RX called to pharmacy  with instructions  that patient needs office visit before further refills. Also called and left message on voicemail at patient 's # with same message .

## 2011-02-15 ENCOUNTER — Ambulatory Visit (INDEPENDENT_AMBULATORY_CARE_PROVIDER_SITE_OTHER): Payer: Self-pay | Admitting: Family Medicine

## 2011-02-15 DIAGNOSIS — F32A Depression, unspecified: Secondary | ICD-10-CM | POA: Insufficient documentation

## 2011-02-15 DIAGNOSIS — F329 Major depressive disorder, single episode, unspecified: Secondary | ICD-10-CM

## 2011-02-15 MED ORDER — ESOMEPRAZOLE MAGNESIUM 40 MG PO CPDR: 40.0000 mg | DELAYED_RELEASE_CAPSULE | Freq: Every day | ORAL | Status: DC

## 2011-02-15 MED ORDER — FLUTICASONE-SALMETEROL 250-50 MCG/DOSE IN AEPB: 1.0000 | INHALATION_SPRAY | Freq: Two times a day (BID) | RESPIRATORY_TRACT | Status: DC

## 2011-02-15 MED ORDER — LORAZEPAM 1 MG PO TABS: 1.0000 mg | ORAL_TABLET | Freq: Two times a day (BID) | ORAL | Status: DC | PRN

## 2011-02-15 MED ORDER — CITALOPRAM HYDROBROMIDE 20 MG PO TABS: 40.0000 mg | ORAL_TABLET | Freq: Every day | ORAL | Status: DC
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
## 2011-02-15 NOTE — Patient Instructions (Signed)
Formal counseling Legal aid Increase citalopram to 2 tabs daily (40 mg) Be very careful with your lorazepam ( use at night for rest and only during the day when needed, you were given #60) and will need to return for refills with Dr. Wynona Neat Best treatment is walk and exercise this will clear you head and help you think about this.
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
## 2011-02-22 ENCOUNTER — Other Ambulatory Visit: Payer: Self-pay | Admitting: Family Medicine

## 2011-02-22 MED ORDER — CITALOPRAM HYDROBROMIDE 20 MG PO TABS: 20.0000 mg | ORAL_TABLET | Freq: Every day | ORAL | Status: DC

## 2011-02-26 NOTE — Discharge Summary (Signed)
NAMERAKHI, ROMAGNOLI NO.:  [id number]   MEDICAL RECORD NO.:  [id number]          PATIENT TYPE:  INP   LOCATION:  9129                          FACILITY:  [facility]   PHYSICIAN:  Janine Limbo, M.D.DATE OF BIRTH:  1965/11/21   DATE OF ADMISSION:  10/21/2004  DATE OF DISCHARGE:  10/25/2004                                 DISCHARGE SUMMARY   ADMITTING DIAGNOSES:  1.  Intrauterine pregnancy at term.  2.  Decreased fetal movement.  3.  Nonreassuring fetal heart rate.  4.  Unfavorable cervix.   PROCEDURE:  Primary low transverse cesarean section.   POSTOPERATIVE DIAGNOSES:  1.  Term intrauterine pregnancy at term.  2.  Nonreassuring fetal heart rate.  3.  Hyperspiraled cord.  4.  Occiput posterior position.   Emma Rhodes is a 45 year old, gravida 5, para 3-0-1-3, who presented at 57-  5/7 weeks from the office of [facility] with a nonreassuring fetal heart rate  secondary to a single variable.  She was offered and accepted induction of  labor.  She was begun on Pitocin per low-dose protocol because she was  contracting every 2-3 minutes on monitor.  She progressed quite rapidly to  complete.  Variable decelerations of the fetal heart rate continued.  After  pushing for 40 minutes, the fetal head remained at a 0 station with variable  decelerations down to 40 beats per minute with or without pushing.  The  patient requested operative cesarean section at this point because operative  vaginal delivery was not an option.  The risks and benefits were discussed  with her by Dr. Dierdre Forth, who performed a primary low transverse  cesarean section with the birth of a 7 pound 8 ounce female infant, with Apgar  scores of 8 at one minute 9 at five minutes.  The patient has done well in  the postoperative period.  Her hemoglobin on the first postoperative day was  9.2.  She has not experienced any signs or symptoms of anemia since this  dizziness or syncope.  She declines blood  transfusion.  Her vital signs have  remained stable.  Her incision is clean and intact.  She has had a J-P drain  which has drained minimally and will be discontinued prior to discharge.  Baby is doing well and breast feeding.  On this, her third postoperative  day, she is judges to be in satisfactory condition for discharge.   DISCHARGE INSTRUCTIONS:  Per [facility] handout.   DISCHARGE MEDICATIONS:  1.  Motrin 600 mg p.o. q.6h. p.r.n. pain.  2.  Prenatal vitamins.  3.  The patient will decide contraceptive options for 6 weeks postpartum      visit.     Emma Rhodes   SDM/MEDQ  D:  10/25/2004  T:  10/25/2004  Job:  [id number]

## 2011-02-26 NOTE — Op Note (Signed)
NAMEHANNAH, STRADER NO.:  [id number]   MEDICAL RECORD NO.:  [id number]          PATIENT TYPE:  INP   LOCATION:  9129                          FACILITY:  [facility]   PHYSICIAN:  Hal Morales, M.D.DATE OF BIRTH:  14-Sep-1966   DATE OF PROCEDURE:  10/22/2004  DATE OF DISCHARGE:                                 OPERATIVE REPORT   PREOPERATIVE DIAGNOSES:  1.  Intrauterine pregnancy at term.  2.  Nonreassuring fetal heart rate tracing.   POSTOPERATIVE DIAGNOSES:  1.  Intrauterine pregnancy at term.  2.  Nonreassuring fetal heart rate tracing.  3.  Hyperspiraled cord.  4.  Occiput posterior position.   SURGEON:  Hal Morales, M.D.   FIRST ASSISTANT:  Cam Hai, C.N.M.   OPERATION:  Primary low transverse cesarean section.   ANESTHESIA:  Spinal.   ESTIMATED BLOOD LOSS:  750 cc.   COMPLICATIONS:  None.   FINDINGS:  The patient was delivered a female infant weighing 7 pounds 8  ounces with Apgars of 8 and 9 at one and five minutes, respectively.  The  uterus, tubes and ovaries were normal for the gravid state.  The placenta  contained an eccentrically-inserted three-vessel cord which appeared  hyperspiraled.   PROCEDURE:  The patient was taken to the operating room after appropriate  identification and placed on the operating table.  After placement of a  spinal anesthetic, the patient was placed in the supine position with a left  lateral tilt.  The abdomen and perineum were prepped with multiple layers of  Betadine and a Foley catheter inserted into the bladder and connected to  straight drainage.  The abdomen was draped as a sterile field.  The  suprapubic region was infiltrated with 0.25% Marcaine for total of 20 mL  after assurance of adequate anesthesia.  A suprapubic incision was made in  the abdomen opened in layers.  The peritoneum was entered and the bladder  blade placed.  The uterus was incised approximately 2 centimeters above the  uterovesical fold and that incision taken laterally on either side.  The  infant was delivered from the occiput posterior position with the aid of a  Kiwi vacuum extractor and after having nares and pharynx suctioned and the  cord clamped and cut was handed off to the awaiting pediatricians.  The  placenta was manually removed from the uterus.  The uterine incision was  closed with a running interlocking suture of 0 Vicryl.  An imbricating  suture of 0 Vicryl was then placed with adequate hemostasis.  The bladder  flap was reapproximated with figure-of-eight suture of 2-0 Vicryl.  Copious  irrigation was carried out and hemostasis was noted to be adequate.  The  abdominal peritoneum was closed with a running suture of 2-0 Vicryl.  The  rectus fascia was closed with a running suture of 0 Vicryl, then reinforced  on either side of midline with figure-of-eight sutures of 0 Vicryl.  The  subcutaneous tissue was made hemostatic with Bovie cautery.  A subcutaneous  Jackson-Pratt drain was placed through a stab wound in the  left lower  quadrant.  The skin incision was closed with skin staples.  A sterile  dressing was applied.  The patient was taken from the operating room to the  recovery room in satisfactory condition, having tolerated procedure well  with sponge and instrument counts correct.  The infant went to the full-term  nursery. The placenta was sent to pathology.     Colin Ina   VPH/MEDQ  D:  10/22/2004  T:  10/22/2004  Job:  [id number]
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
## 2011-02-26 NOTE — Op Note (Signed)
[facility]  Patient:    KIELI, GOLLADAY                         MRN: [id number] Adm. Date:  [id number] Disc. Date: [id number] Attending:  Michaelle Copas CC:         [facility]   Operative Report  PREOPERATIVE DIAGNOSIS:       A 6-week embryonic demise.  POSTOPERATIVE DIAGNOSIS:      1. A 6-week embryonic demise.                               2. Incomplete abortion.  PROCEDURE:                    Suction dilatation and curettage.  SURGEON:                      Roseanna Rainbow, M.D.  ANESTHESIA:                   Paracervical block, Managed Anesthesia Care.  COMPLICATIONS:                None.  ESTIMATED BLOOD LOSS:         50 cc.  FINDINGS:                     Anteverted uterus, slightly enlarged, moderate products of conception.  DESCRIPTION OF PROCEDURE:     The patient was taken to the operating room. A sterile speculum was placed in the vagina and the patient was noted to be 1 cm dilated with products of conception present at the os. The anterior lip of the cervix was then infiltrated with 1% lidocaine. A single-tooth tenaculum was then applied to this location and 5 cc of lidocaine were injected at 4 and 7 oclock to produce the paracervical block. The products of conception present at the os were then retrieved. A 7 mm suction curet was then advanced gently to the uterine fundus. The suction device was then activated and the curet rotated to clear the uterus of the remaining products of conception. A sharp curettage was then performed until a gritty texture was noted. The suction curet was then reintroduced to clear the uterus of the remaining products of conception. There was minimal bleeding noted and the tenaculum removed with good hemostasis noted. The patient tolerated the procedure well. The patient was taken to the PACU in stable condition.  PATHOLOGY:                    Products of  conception. DD:  11/18/00 TD:  11/19/00 Job: [id number]

## 2011-04-28 ENCOUNTER — Other Ambulatory Visit: Payer: Self-pay | Admitting: Family Medicine

## 2011-04-28 MED ORDER — ESOMEPRAZOLE MAGNESIUM 40 MG PO CPDR: 40.0000 mg | DELAYED_RELEASE_CAPSULE | Freq: Every day | ORAL | Status: DC

## 2011-05-05 ENCOUNTER — Ambulatory Visit (INDEPENDENT_AMBULATORY_CARE_PROVIDER_SITE_OTHER): Payer: Self-pay | Admitting: Family Medicine

## 2011-05-05 ENCOUNTER — Encounter: Payer: Self-pay | Admitting: Family Medicine

## 2011-05-05 VITALS — BP 105/72 | HR 81 | Temp 98.4°F | Ht 66.0 in | Wt 180.0 lb

## 2011-05-05 DIAGNOSIS — M25569 Pain in unspecified knee: Secondary | ICD-10-CM

## 2011-05-05 DIAGNOSIS — F329 Major depressive disorder, single episode, unspecified: Secondary | ICD-10-CM

## 2011-05-05 DIAGNOSIS — M25562 Pain in left knee: Secondary | ICD-10-CM

## 2011-05-05 DIAGNOSIS — F4323 Adjustment disorder with mixed anxiety and depressed mood: Secondary | ICD-10-CM

## 2011-05-05 DIAGNOSIS — M25561 Pain in right knee: Secondary | ICD-10-CM | POA: Insufficient documentation

## 2011-05-05 DIAGNOSIS — J45909 Unspecified asthma, uncomplicated: Secondary | ICD-10-CM

## 2011-05-05 MED ORDER — BUSPIRONE HCL 10 MG PO TABS: 10.0000 mg | ORAL_TABLET | Freq: Two times a day (BID) | ORAL | Status: AC

## 2011-05-05 MED ORDER — LORAZEPAM 1 MG PO TABS: 1.0000 mg | ORAL_TABLET | Freq: Two times a day (BID) | ORAL | Status: DC | PRN

## 2011-05-05 MED ORDER — BUPROPION HCL ER (SR) 150 MG PO TB12: 150.0000 mg | ORAL_TABLET | Freq: Two times a day (BID) | ORAL | Status: DC

## 2011-05-05 MED ORDER — MELOXICAM 7.5 MG PO TABS: 7.5000 mg | ORAL_TABLET | Freq: Every day | ORAL | Status: AC
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
## 2011-05-05 NOTE — Assessment & Plan Note (Signed)
Well controlled, has been using her maintenance meds as directed.  To continue with these as she is doing.

## 2011-05-05 NOTE — Assessment & Plan Note (Signed)
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  No signs of joint instability, improves with ibuprofen.  Will encourage acetaminophen as baseline med, may use Mobic as well.  ROM exercises, will discuss weight loss as part of management as well.

## 2011-05-05 NOTE — Patient Instructions (Signed)
It was a pleasure to see you today.   For medication changes, this is our new plan:  1) Stop the Celexa (you already did).  2) Start the Bupropion SR 150mg  tablets (Wellbutrin), take 1 tablet one time daily for the first week, then take 1 tablet twice daily afterward.  This is to replace the Celexa for depressive symptoms and I believe it will help with the anxiety somewhat. AT [facility] HEALTH DEPT  3) For the anxiety, I am changing you to a medication that does not promote dependence, called Buspirone (Buspar). Take 1 tablet twice daily everyday; again, you may start with one tablet daily to see how it affects you. AT [facility]  4) I am refilling the Lorazepam, to take only when you are very anxious.  Please use only as needed.  I would like to see you back in the coming 4 to 6 weeks to see how much you have needed, and how you are doing overall.  Call me if you have a need before the 1 month follow up. AT [facility] HEALTH DEPT  5)For your knees; I prescribed Meloxicam 7.5mg  at [facility] pharmacy. Take 1 tablet one time daily with food. Also, you may try using Tylenol extra strength 2 tablets every 6 hours as needed, as your mainstay of arthritis treatment. AT [facility] FRONT DESK: PLEASE GIVE APPT FOLLOW UP WITH DR Mauricio Po IN 4-6 WEEKS.  PLEASE GIVE DR KANE'S CARD FOR PATIENT TO MAKE APPT WITH HER.
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
## 2011-05-11 ENCOUNTER — Other Ambulatory Visit: Payer: Self-pay | Admitting: Family Medicine

## 2011-05-11 DIAGNOSIS — F329 Major depressive disorder, single episode, unspecified: Secondary | ICD-10-CM

## 2011-05-11 MED ORDER — LORAZEPAM 1 MG PO TABS: 1.0000 mg | ORAL_TABLET | Freq: Two times a day (BID) | ORAL | Status: DC | PRN

## 2011-05-27 ENCOUNTER — Ambulatory Visit: Payer: Self-pay | Admitting: Family Medicine

## 2011-05-27 ENCOUNTER — Ambulatory Visit: Payer: Self-pay

## 2011-05-28 ENCOUNTER — Ambulatory Visit (INDEPENDENT_AMBULATORY_CARE_PROVIDER_SITE_OTHER): Payer: Self-pay | Admitting: Family Medicine

## 2011-05-28 ENCOUNTER — Encounter: Payer: Self-pay | Admitting: Family Medicine

## 2011-05-28 VITALS — BP 117/73 | HR 83 | Temp 97.7°F | Wt 182.0 lb

## 2011-05-28 DIAGNOSIS — R51 Headache: Secondary | ICD-10-CM

## 2011-05-28 DIAGNOSIS — R519 Headache, unspecified: Secondary | ICD-10-CM | POA: Insufficient documentation

## 2011-05-28 MED ORDER — TRAMADOL-ACETAMINOPHEN 37.5-325 MG PO TABS: 2.0000 | ORAL_TABLET | Freq: Four times a day (QID) | ORAL | Status: AC | PRN

## 2011-05-28 MED ORDER — CYCLOBENZAPRINE HCL 5 MG PO TABS: 5.0000 mg | ORAL_TABLET | Freq: Every evening | ORAL | Status: AC | PRN

## 2011-05-28 NOTE — Progress Notes (Signed)
  Subjective:    Patient ID: Emma Rhodes, female    DOB: Sep 19, 1966, 45 y.o.   MRN: [id number]  HPI  Pt with 5 days of HA that is right sided as well as right arm and shoulder pain.  This pain is continuous and does not go away with sleep.  It will get a little better with NSAIDs but comes right back.  She describes it as starting in the occiput and going toward her right eye and that there is pain behind the right eye.  No vision changes.  Occasional mild nausea, no photophobia.  Her arm pain has been at the same time.  It is occasionally sharp but mostly throbbing. It is diffuse.  She has not noticed weakness.    She has a lot of personal stress but feels that this has not changed recently.  Review of Systems Denies CP, SOB, V/D, fever     Objective:   Physical Exam  Vital signs reviewed General appearance - alert, well appearing, and in no distress and oriented to person, place, and time Heart - normal rate, regular rhythm, normal S1, S2, no murmurs, rubs, clicks or gallops Chest - clear to auscultation, no wheezes, rales or rhonchi, symmetric air entry, no tachypnea, retractions or cyanosis Neurological - alert, oriented, normal speech, no focal findings or movement disorder noted, screening mental status exam normal, neck supple without rigidity, cranial nerves II through XII intact--normal fundus bilaterally (confirmed by Dr. Sheffield Slider).  Negative spurlings test.  Mild tingling in the 2nd and 3rd finer of right hand       Assessment & Plan:  Headache No red flags today for HA.  Examined with Dr. Sheffield Slider.  Likely related to muscle spasm vs medication overuse.  Pt may have an element of cervical radiculopathy.  Will try flexeril and tramadol.  Continue mobic for arthritis but stop other NSAIDS.  I think that her psychosocial issues are playing a part in her pain as well.

## 2011-05-28 NOTE — Assessment & Plan Note (Signed)
No red flags today for HA.  Examined with Dr. Sheffield Slider.  Likely related to muscle spasm vs medication overuse.  Pt may have an element of cervical radiculopathy.  Will try flexeril and tramadol.  Continue mobic for arthritis but stop other NSAIDS.  I think that her psychosocial issues are playing a part in her pain as well.

## 2011-05-28 NOTE — Patient Instructions (Signed)
Please stop taking naproxen (or Aleve) and motrin (or ibuprofen) Continue the daily mobic  For the spasms:  Try flexiril at night Use ultracet for pain relief ( you may take 1-2 up to 4 times/day)  Do not take any tylenol or acetominophen while you are taking ultracet  Sleep with good neck support

## 2011-06-08 ENCOUNTER — Ambulatory Visit: Payer: Self-pay | Admitting: Family Medicine

## 2012-02-07 ENCOUNTER — Emergency Department (INDEPENDENT_AMBULATORY_CARE_PROVIDER_SITE_OTHER)
Admission: EM | Admit: 2012-02-07 | Discharge: 2012-02-07 | Disposition: A | Discharge status: Home / Self Care | Payer: Self-pay | Source: Home / Self Care | Attending: Emergency Medicine | Admitting: Emergency Medicine

## 2012-02-07 ENCOUNTER — Encounter (HOSPITAL_COMMUNITY): Payer: Self-pay | Admitting: Emergency Medicine

## 2012-02-07 DIAGNOSIS — N3000 Acute cystitis without hematuria: Secondary | ICD-10-CM

## 2012-02-07 LAB — POCT URINALYSIS DIP (DEVICE)
Bilirubin Urine: NEGATIVE
Glucose, UA: NEGATIVE mg/dL
Nitrite: NEGATIVE

## 2012-02-07 LAB — POCT PREGNANCY, URINE: Preg Test, Ur: NEGATIVE

## 2012-02-07 MED ORDER — PHENAZOPYRIDINE HCL 200 MG PO TABS: 200.0000 mg | ORAL_TABLET | Freq: Three times a day (TID) | ORAL | Status: AC | PRN

## 2012-02-07 MED ORDER — CEPHALEXIN 500 MG PO CAPS: 500.0000 mg | ORAL_CAPSULE | Freq: Three times a day (TID) | ORAL | Status: AC

## 2012-02-07 NOTE — ED Provider Notes (Signed)
Chief Complaint  Patient presents with  . Urinary Tract Infection    History of Present Illness:   The patient has had a three-week history of urinary symptoms and she describes cloudy urine, burning dysuria, frequency, urgency, suprapubic pain, and chills. When this first came on she thinks she had fever. She went to a chiropractor for some other issues and was given a detox regimen which included some pills and some liquids that she drank. She thought this would help him for a while felt better, but then the symptoms seem to come back again over the past 3 days. She denies any nausea, vomiting, or GYN complaints. Her last menstrual period was 10 days ago. She had a urinary tract infection about 3 years ago.  Review of Systems:  Other than noted above, the patient denies any of the following symptoms: General:  No fevers, chills, sweats, aches, or fatigue. GI:  No abdominal pain, back pain, nausea, vomiting, diarrhea, or constipation. GU:  No dysuria, frequency, urgency, hematuria, or incontinence. GYN:  No discharge, itching, vulvar pain or lesions, pelvic pain, or abnormal vaginal bleeding.  PMFSH:  Past medical history, family history, social history, meds, and allergies were reviewed.  Physical Exam:   Vital signs:  BP 122/71  Pulse 86  Temp(Src) 98.6 F (37 C) (Oral)  Resp 16  SpO2 100%  LMP 01/28/2012 Gen:  Alert, oriented, in no distress. Lungs:  Clear to auscultation, no wheezes, rales or rhonchi. Heart:  Regular rhythm, no gallop or murmer. Abdomen:  Flat and soft. There was slight suprapubic pain to palpation.  No guarding, or rebound.  No hepato-splenomegaly or mass.  Bowel sounds were normally active.  No hernia. Back:  No CVA tenderness.  Skin:  Clear, warm and dry.  Labs:   Results for orders placed during the hospital encounter of 02/07/12  POCT URINALYSIS DIP (DEVICE)      Component Value Range   Glucose, UA NEGATIVE  NEGATIVE (mg/dL)   Bilirubin Urine NEGATIVE   NEGATIVE    Ketones, ur NEGATIVE  NEGATIVE (mg/dL)   Specific Gravity, Urine 1.010  1.005 - 1.030    Hgb urine dipstick LARGE (*) NEGATIVE    pH 6.0  5.0 - 8.0    Protein, ur 30 (*) NEGATIVE (mg/dL)   Urobilinogen, UA 0.2  0.0 - 1.0 (mg/dL)   Nitrite NEGATIVE  NEGATIVE    Leukocytes, UA LARGE (*) NEGATIVE   POCT PREGNANCY, URINE      Component Value Range   Preg Test, Ur NEGATIVE  NEGATIVE     Other Labs Obtained at Urgent Care Center:  A urine culture was obtained.  Results are pending at this time and we will call about any positive results.  Assessment: The encounter diagnosis was Acute cystitis.   Plan:   1.  The following meds were prescribed:   New Prescriptions   CEPHALEXIN (KEFLEX) 500 MG CAPSULE    Take 1 capsule (500 mg total) by mouth 3 (three) times daily.   PHENAZOPYRIDINE (PYRIDIUM) 200 MG TABLET    Take 1 tablet (200 mg total) by mouth 3 (three) times daily as needed for pain.   2.  The patient was instructed in symptomatic care and handouts were given. 3.  The patient was told to return if becoming worse in any way, if no better in 3 or 4 days, and given some red flag symptoms that would indicate earlier return. 4.  The patient was told to avoid intercourse for 10  days, get extra fluids, and return for a follow up with her primary care doctor at the completion of treatment for a repeat UA and culture.     Reuben Likes, MD 02/07/12 [phone]

## 2012-02-07 NOTE — Discharge Instructions (Signed)

## 2012-02-07 NOTE — ED Notes (Signed)
C/o urinary burning, frequency, cloudy urine and suprapubic pain for 3 days.  States she had same sx 3 weeks and took detox regimen from chiropractor, sx subsided for a while but returned.

## 2012-02-09 LAB — URINE CULTURE
Colony Count: >=100000
Culture  Setup Time: 201304292153

## 2012-02-09 NOTE — ED Notes (Signed)
Urine culture: >100,000 colonies Group B strep (s. Agalactiae).  No sensitivity due to predictability of Penicillin. Lab shown to Dr. Lorenz Coaster and he said pt. adequately treated with Keflex. Vassie Moselle 02/09/2012

## 2012-02-29 ENCOUNTER — Encounter: Payer: Self-pay | Admitting: Obstetrics and Gynecology

## 2013-02-05 ENCOUNTER — Other Ambulatory Visit: Payer: Self-pay

## 2013-02-05 DIAGNOSIS — Z1231 Encounter for screening mammogram for malignant neoplasm of breast: Secondary | ICD-10-CM

## 2013-02-06 ENCOUNTER — Ambulatory Visit
Admission: RE | Admit: 2013-02-06 | Discharge: 2013-02-06 | Disposition: A | Discharge status: Home / Self Care | Payer: Self-pay | Source: Ambulatory Visit

## 2013-02-06 DIAGNOSIS — Z1231 Encounter for screening mammogram for malignant neoplasm of breast: Secondary | ICD-10-CM

## 2013-03-19 ENCOUNTER — Emergency Department (INDEPENDENT_AMBULATORY_CARE_PROVIDER_SITE_OTHER)
Admission: EM | Admit: 2013-03-19 | Discharge: 2013-03-19 | Disposition: A | Discharge status: Home / Self Care | Payer: Self-pay | Source: Home / Self Care

## 2013-03-19 ENCOUNTER — Encounter (HOSPITAL_COMMUNITY): Payer: Self-pay | Admitting: Emergency Medicine

## 2013-03-19 DIAGNOSIS — N390 Urinary tract infection, site not specified: Secondary | ICD-10-CM

## 2013-03-19 LAB — POCT URINALYSIS DIP (DEVICE)
Bilirubin Urine: NEGATIVE
Ketones, ur: NEGATIVE mg/dL
Specific Gravity, Urine: 1.02 (ref 1.005–1.030)
pH: 6.5 (ref 5.0–8.0)

## 2013-03-19 MED ORDER — CEFTRIAXONE SODIUM 1 G IJ SOLR
INTRAMUSCULAR | Status: AC
  Filled 2013-03-19: qty 10

## 2013-03-19 MED ORDER — LIDOCAINE HCL (PF) 1 % IJ SOLN
INTRAMUSCULAR | Status: AC
  Filled 2013-03-19: qty 5

## 2013-03-19 MED ORDER — CEPHALEXIN 500 MG PO CAPS: 500.0000 mg | ORAL_CAPSULE | Freq: Four times a day (QID) | ORAL | Status: DC

## 2013-03-19 MED ORDER — CEFTRIAXONE SODIUM 1 G IJ SOLR
1.0000 g | Freq: Once | INTRAMUSCULAR | Status: AC
  Administered 2013-03-19: 1 g via INTRAMUSCULAR

## 2013-03-19 NOTE — ED Provider Notes (Signed)
History     CSN: [id number]  Arrival date & time 03/19/13  1752   None     Chief Complaint  Patient presents with  . Urinary Tract Infection    (Consider location/radiation/quality/duration/timing/severity/associated sxs/prior treatment) HPI Comments: 47 year old female presents with 3 days of dysuria, dark malodorous urine, urinary urgency and frequency. Also complaining of bloating MG level and fatigue. Denies gross hematuria.   Past Medical History  Diagnosis Date  . Asthma   . HSV infection   . HPV (human papilloma virus) infection   . Vaginitis   . Asthma   . Varicose veins   . Mumps   . Measles   . Low iron   . Ovarian cyst   . GERD (gastroesophageal reflux disease)     Past Surgical History  Procedure Laterality Date  . Cesarean section    . Dilation and curettage of uterus      Family History  Problem Relation Age of Onset  . Dementia Father     History  Substance Use Topics  . Smoking status: Never Smoker   . Smokeless tobacco: Never Used  . Alcohol Use: No    OB History   Grav Para Term Preterm Abortions TAB SAB Ect Mult Living   5 4              Review of Systems  Constitutional: Positive for activity change.  HENT: Negative.   Respiratory: Negative.   Gastrointestinal: Negative.   Genitourinary: Positive for dysuria, urgency and frequency. Negative for vaginal bleeding and vaginal discharge.  Neurological: Negative.   Hematological: Negative.     Allergies  Review of patient's allergies indicates no known allergies.  Home Medications   Current Outpatient Rx  Name  Route  Sig  Dispense  Refill  . drospirenone-ethinyl estradiol (YAZ,GIANVI,LORYNA) 3-0.02 MG tablet   Oral   Take 1 tablet by mouth daily.         Marland Kitchen OVER THE COUNTER MEDICATION      Pain killers, cranberry juice         . EXPIRED: buPROPion (WELLBUTRIN SR) 150 MG 12 hr tablet   Oral   Take 1 tablet (150 mg total) by mouth 2 (two) times daily.   60 tablet  4   . cephALEXin (KEFLEX) 500 MG capsule   Oral   Take 1 capsule (500 mg total) by mouth 4 (four) times daily.   28 capsule   0   . esomeprazole (NEXIUM) 40 MG capsule   Oral   Take 1 capsule (40 mg total) by mouth daily. DISP Quant sufficient 3 months   180 capsule   1   . Fluticasone-Salmeterol (ADVAIR DISKUS) 250-50 MCG/DOSE AEPB   Inhalation   Inhale 1 puff into the lungs every 12 (twelve) hours. DISP quantity sufficient 3 months   60 each   6   . LORazepam (ATIVAN) 1 MG tablet   Oral   Take 1 tablet (1 mg total) by mouth 2 (two) times daily as needed for anxiety.   60 tablet   1     LMP 03/09/2013  Physical Exam  Nursing note and vitals reviewed. Constitutional: She is oriented to person, place, and time. She appears well-developed and well-nourished. No distress.  Neck: Normal range of motion. Neck supple.  Cardiovascular: Normal rate, regular rhythm and normal heart sounds.   Pulmonary/Chest: Effort normal and breath sounds normal. No respiratory distress. She has no wheezes.  Abdominal: Soft. She exhibits no distension and  no mass. There is no tenderness. There is no rebound and no guarding.  Mild to moderate suprapubic tenderness. No abdominal tenderness.  Musculoskeletal: She exhibits no edema.  Lymphadenopathy:    She has no cervical adenopathy.  Neurological: She is alert and oriented to person, place, and time. She exhibits normal muscle tone.  Skin: Skin is warm and dry. No rash noted.  Psychiatric: She has a normal mood and affect.    ED Course  Procedures (including critical care time)  Labs Reviewed  URINE CULTURE   No results found.  Results for orders placed during the hospital encounter of 03/19/13  POCT URINALYSIS DIP (DEVICE)      Result Value Range   Glucose, UA NEGATIVE  NEGATIVE mg/dL   Bilirubin Urine NEGATIVE  NEGATIVE   Ketones, ur NEGATIVE  NEGATIVE mg/dL   Specific Gravity, Urine 1.020  1.005 - 1.030   Hgb urine dipstick LARGE  (*) NEGATIVE   pH 6.5  5.0 - 8.0   Protein, ur 100 (*) NEGATIVE mg/dL   Urobilinogen, UA 0.2  0.0 - 1.0 mg/dL   Nitrite POSITIVE (*) NEGATIVE   Leukocytes, UA LARGE (*) NEGATIVE      1. UTI (lower urinary tract infection)       MDM  Drink plenty of fluids and stay well-hydrated. Continue to drink cranberry juice. Rocephin 1 g IM Keflex 500 mg 4 times a day for 7 days Azo OTC 1 3 times a day when necessary urinary symptoms. Tylenol every 4 hours when necessary pain and discomfort.        Hayden Rasmussen, NP 03/19/13 1942

## 2013-03-19 NOTE — ED Notes (Signed)
Unable to discharge patient --patient has antibiotic ordered

## 2013-03-19 NOTE — ED Notes (Signed)
Reports 3 day history of uti symptoms: pain, odor, dark color

## 2013-03-19 NOTE — ED Provider Notes (Signed)
Medical screening examination/treatment/procedure(s) were performed by non-physician practitioner and as supervising physician I was immediately available for consultation/collaboration.  Raynald Blend, MD 03/19/13 1949

## 2013-03-21 LAB — URINE CULTURE: Special Requests: NORMAL

## 2013-03-22 NOTE — ED Notes (Signed)
Urine culture: >100,000 colonies E. Coli.  Pt. adequately treated with Keflex. Vassie Moselle 03/22/2013

## 2013-06-01 ENCOUNTER — Encounter (HOSPITAL_COMMUNITY): Payer: Self-pay

## 2013-06-01 ENCOUNTER — Emergency Department (INDEPENDENT_AMBULATORY_CARE_PROVIDER_SITE_OTHER)
Admission: EM | Admit: 2013-06-01 | Discharge: 2013-06-01 | Disposition: A | Discharge status: Home / Self Care | Payer: Self-pay | Source: Home / Self Care | Attending: Emergency Medicine | Admitting: Emergency Medicine

## 2013-06-01 DIAGNOSIS — N390 Urinary tract infection, site not specified: Secondary | ICD-10-CM

## 2013-06-01 LAB — POCT URINALYSIS DIP (DEVICE)
Ketones, ur: NEGATIVE mg/dL
Protein, ur: 100 mg/dL — AB
Specific Gravity, Urine: 1.025 (ref 1.005–1.030)
pH: 6 (ref 5.0–8.0)

## 2013-06-01 MED ORDER — LIDOCAINE HCL (PF) 1 % IJ SOLN
INTRAMUSCULAR | Status: AC
  Filled 2013-06-01: qty 5

## 2013-06-01 MED ORDER — ZINC OXIDE 12.8 % EX OINT: TOPICAL_OINTMENT | CUTANEOUS | Status: DC | PRN

## 2013-06-01 MED ORDER — ONDANSETRON HCL 4 MG PO TABS: 4.0000 mg | ORAL_TABLET | Freq: Three times a day (TID) | ORAL | Status: DC | PRN

## 2013-06-01 MED ORDER — CEFTRIAXONE SODIUM 1 G IJ SOLR
INTRAMUSCULAR | Status: AC
  Filled 2013-06-01: qty 10

## 2013-06-01 MED ORDER — CEPHALEXIN 500 MG PO CAPS: 500.0000 mg | ORAL_CAPSULE | Freq: Four times a day (QID) | ORAL | Status: DC

## 2013-06-01 MED ORDER — CEFTRIAXONE SODIUM 1 G IJ SOLR
1.0000 g | Freq: Once | INTRAMUSCULAR | Status: AC
  Administered 2013-06-01: 1 g via INTRAMUSCULAR

## 2013-06-01 NOTE — ED Provider Notes (Signed)
Medical screening examination/treatment/procedure(s) were performed by a resident physician and as supervising physician I was immediately available for consultation/collaboration.  Leslee Home, M.D.   Reuben Likes, MD 06/01/13 2215

## 2013-06-01 NOTE — ED Notes (Signed)
Reportedly has UTI sensation ; minimal relief w OTC treatment

## 2013-06-01 NOTE — ED Provider Notes (Signed)
CSN: [id number]     Arrival date & time 06/01/13  1835 History     First MD Initiated Contact with Patient 06/01/13 1936     Chief Complaint  Patient presents with  . Urinary Tract Infection   (Consider location/radiation/quality/duration/timing/severity/associated sxs/prior Treatment) HPI Patient is a 47 yo F presenting with dysuria. She reports abdominal pain and pressure. She has tried drinking water, cranberry juice and AZO which has not helped. She endorses nausea and decreased appetite. Very mild back pain. Denies hematuria, but endorses an odor to her urine. She reports fever at nighttime, but afebrile today. Last UTI was within last 2 months but does not have longstanding history of recurrent UTI. No chance of pregnancy.   Also, patient just finished her menses. She was using a yogurt douche and wore sanitary pad. She now has an irritating rash.  Past Medical History  Diagnosis Date  . Asthma   . HSV infection   . HPV (human papilloma virus) infection   . Vaginitis   . Asthma   . Varicose veins   . Mumps   . Measles   . Low iron   . Ovarian cyst   . GERD (gastroesophageal reflux disease)    Past Surgical History  Procedure Laterality Date  . Cesarean section    . Dilation and curettage of uterus     Family History  Problem Relation Age of Onset  . Dementia Father    History  Substance Use Topics  . Smoking status: Never Smoker   . Smokeless tobacco: Never Used  . Alcohol Use: No   OB History   Grav Para Term Preterm Abortions TAB SAB Ect Mult Living   5 4             Review of Systems  Constitutional: Positive for fever and fatigue. Negative for chills.  HENT: Negative for congestion.   Eyes: Negative for visual disturbance.  Respiratory: Negative for cough and shortness of breath.   Cardiovascular: Negative for chest pain and leg swelling.  Gastrointestinal: Positive for nausea and abdominal pain. Negative for vomiting.  Genitourinary: Positive for  dysuria, urgency, frequency, flank pain and pelvic pain. Negative for vaginal bleeding, difficulty urinating and menstrual problem.  Musculoskeletal: Negative for myalgias and arthralgias.  Skin: Negative for rash.  Neurological: Negative for headaches.    Allergies  Review of patient's allergies indicates no known allergies.  Home Medications   Current Outpatient Rx  Name  Route  Sig  Dispense  Refill  . EXPIRED: buPROPion (WELLBUTRIN SR) 150 MG 12 hr tablet   Oral   Take 1 tablet (150 mg total) by mouth 2 (two) times daily.   60 tablet   4   . cephALEXin (KEFLEX) 500 MG capsule   Oral   Take 1 capsule (500 mg total) by mouth 4 (four) times daily.   40 capsule   0   . drospirenone-ethinyl estradiol (YAZ,GIANVI,LORYNA) 3-0.02 MG tablet   Oral   Take 1 tablet by mouth daily.         Marland Kitchen esomeprazole (NEXIUM) 40 MG capsule   Oral   Take 1 capsule (40 mg total) by mouth daily. DISP Quant sufficient 3 months   180 capsule   1   . Fluticasone-Salmeterol (ADVAIR DISKUS) 250-50 MCG/DOSE AEPB   Inhalation   Inhale 1 puff into the lungs every 12 (twelve) hours. DISP quantity sufficient 3 months   60 each   6   . LORazepam (ATIVAN) 1 MG  tablet   Oral   Take 1 tablet (1 mg total) by mouth 2 (two) times daily as needed for anxiety.   60 tablet   1   . ondansetron (ZOFRAN) 4 MG tablet   Oral   Take 1 tablet (4 mg total) by mouth every 8 (eight) hours as needed for nausea.   20 tablet   0   . OVER THE COUNTER MEDICATION      Pain killers, cranberry juice         . Zinc Oxide (TRIPLE PASTE) 12.8 % ointment   Topical   Apply topically as needed for dry skin.   56.7 g   0    BP 113/67  Pulse 80  Temp(Src) 97.5 F (36.4 C) (Oral)  Resp 16  SpO2 100% Physical Exam  Constitutional: She is oriented to person, place, and time. She appears well-developed and well-nourished. No distress.  HENT:  Head: Normocephalic and atraumatic.  Mouth/Throat: Oropharynx is clear  and moist.  Neck: Normal range of motion.  Cardiovascular: Normal rate and regular rhythm.   Pulmonary/Chest: Effort normal and breath sounds normal.  Abdominal: Soft. There is tenderness (Suprapubic tenderness and mild bilateral CVA tenderness).  Musculoskeletal: Normal range of motion. She exhibits no edema and no tenderness.  Neurological: She is alert and oriented to person, place, and time.  Skin: Skin is warm and dry.    ED Course   Procedures (including critical care time)  Labs Reviewed  POCT URINALYSIS DIP (DEVICE) - Abnormal; Notable for the following:    Hgb urine dipstick MODERATE (*)    Protein, ur 100 (*)    Leukocytes, UA LARGE (*)    All other components within normal limits  URINE CULTURE  POCT PREGNANCY, URINE   No results found. 1. UTI (urinary tract infection)     MDM  47 yo F with recurrent UTI  -Will send urine for culture -CTX x1 in clinic -Keflex 500mg  QID x 10 days -Zofran prn nausea -OTC Azo prn for pain -Triple paste as needed for barrier cream for external vaginal rash due to irritation from sanitary pad -F/u with Dr. Mauricio Po at [facility] next week, return to [facility] over weekend if symptoms worsen or fail to improve  Hilarie Fredrickson, MD 06/01/13 2029

## 2013-06-03 LAB — URINE CULTURE

## 2013-06-19 ENCOUNTER — Ambulatory Visit: Payer: Self-pay | Admitting: Family Medicine

## 2013-10-21 ENCOUNTER — Emergency Department (INDEPENDENT_AMBULATORY_CARE_PROVIDER_SITE_OTHER)
Admission: EM | Admit: 2013-10-21 | Discharge: 2013-10-21 | Disposition: A | Discharge status: Home / Self Care | Payer: Self-pay | Source: Home / Self Care | Attending: Family Medicine | Admitting: Family Medicine

## 2013-10-21 ENCOUNTER — Encounter (HOSPITAL_COMMUNITY): Payer: Self-pay | Admitting: Emergency Medicine

## 2013-10-21 DIAGNOSIS — N390 Urinary tract infection, site not specified: Secondary | ICD-10-CM

## 2013-10-21 LAB — POCT URINALYSIS DIP (DEVICE)
BILIRUBIN URINE: NEGATIVE
Glucose, UA: NEGATIVE mg/dL
KETONES UR: NEGATIVE mg/dL
Nitrite: NEGATIVE
PH: 7 (ref 5.0–8.0)
Protein, ur: 100 mg/dL — AB
SPECIFIC GRAVITY, URINE: 1.02 (ref 1.005–1.030)
Urobilinogen, UA: 0.2 mg/dL (ref 0.0–1.0)

## 2013-10-21 MED ORDER — CEPHALEXIN 500 MG PO CAPS: 1000.0000 mg | ORAL_CAPSULE | Freq: Two times a day (BID) | ORAL | Status: DC

## 2013-10-21 MED ORDER — PHENAZOPYRIDINE HCL 200 MG PO TABS: 200.0000 mg | ORAL_TABLET | Freq: Three times a day (TID) | ORAL | Status: DC

## 2013-10-21 MED ORDER — CEFIXIME 400 MG PO TABS
400.0000 mg | ORAL_TABLET | Freq: Once | ORAL | Status: AC
  Administered 2013-10-21: 400 mg via ORAL

## 2013-10-21 MED ORDER — CEFIXIME 400 MG PO TABS
ORAL_TABLET | ORAL | Status: AC
  Filled 2013-10-21: qty 1

## 2013-10-21 NOTE — ED Provider Notes (Signed)
CSN: [id number]     Arrival date & time 10/21/13  1805 History   First MD Initiated Contact with Patient 10/21/13 1909     Chief Complaint  Patient presents with  . Urinary Tract Infection   (Consider location/radiation/quality/duration/timing/severity/associated sxs/prior Treatment) HPI Comments: 48 year old female presents complaining of possible urinary tract infection. For 5 days, she has progressively worsening lower abdominal pain, urinary frequency, urgency, burning, dysuria. She also admits to some nausea and intermittent subjective fevers. She denies vomiting, diarrhea, or any other systemic symptoms. She has been drinking lots of water and taking over-the-counter AZO to relieve her symptoms but has not yet helped  Patient is a 48 y.o. female presenting with urinary tract infection.  Urinary Tract Infection Associated symptoms include abdominal pain. Pertinent negatives include no chest pain and no shortness of breath.    Past Medical History  Diagnosis Date  . Asthma   . HSV infection   . HPV (human papilloma virus) infection   . Vaginitis   . Asthma   . Varicose veins   . Mumps   . Measles   . Low iron   . Ovarian cyst   . GERD (gastroesophageal reflux disease)    Past Surgical History  Procedure Laterality Date  . Cesarean section    . Dilation and curettage of uterus     Family History  Problem Relation Age of Onset  . Dementia Father    History  Substance Use Topics  . Smoking status: Never Smoker   . Smokeless tobacco: Never Used  . Alcohol Use: No   OB History   Grav Para Term Preterm Abortions TAB SAB Ect Mult Living   5 4             Review of Systems  Constitutional: Negative for fever and chills.  Eyes: Negative for visual disturbance.  Respiratory: Negative for cough and shortness of breath.   Cardiovascular: Negative for chest pain, palpitations and leg swelling.  Gastrointestinal: Positive for nausea and abdominal pain. Negative for  vomiting.  Endocrine: Negative for polydipsia and polyuria.  Genitourinary: Positive for dysuria, urgency and frequency.  Musculoskeletal: Negative for arthralgias and myalgias.  Skin: Negative for rash.  Neurological: Negative for dizziness, weakness and light-headedness.    Allergies  Review of patient's allergies indicates no known allergies.  Home Medications   Current Outpatient Rx  Name  Route  Sig  Dispense  Refill  . EXPIRED: buPROPion (WELLBUTRIN SR) 150 MG 12 hr tablet   Oral   Take 1 tablet (150 mg total) by mouth 2 (two) times daily.   60 tablet   4   . cephALEXin (KEFLEX) 500 MG capsule   Oral   Take 1 capsule (500 mg total) by mouth 4 (four) times daily.   40 capsule   0   . cephALEXin (KEFLEX) 500 MG capsule   Oral   Take 2 capsules (1,000 mg total) by mouth 2 (two) times daily.   20 capsule   0   . drospirenone-ethinyl estradiol (YAZ,GIANVI,LORYNA) 3-0.02 MG tablet   Oral   Take 1 tablet by mouth daily.         Marland Kitchen esomeprazole (NEXIUM) 40 MG capsule   Oral   Take 1 capsule (40 mg total) by mouth daily. DISP Quant sufficient 3 months   180 capsule   1   . Fluticasone-Salmeterol (ADVAIR DISKUS) 250-50 MCG/DOSE AEPB   Inhalation   Inhale 1 puff into the lungs every 12 (twelve) hours. DISP  quantity sufficient 3 months   60 each   6   . LORazepam (ATIVAN) 1 MG tablet   Oral   Take 1 tablet (1 mg total) by mouth 2 (two) times daily as needed for anxiety.   60 tablet   1   . ondansetron (ZOFRAN) 4 MG tablet   Oral   Take 1 tablet (4 mg total) by mouth every 8 (eight) hours as needed for nausea.   20 tablet   0   . OVER THE COUNTER MEDICATION      Pain killers, cranberry juice         . phenazopyridine (PYRIDIUM) 200 MG tablet   Oral   Take 1 tablet (200 mg total) by mouth 3 (three) times daily.   6 tablet   0   . Zinc Oxide (TRIPLE PASTE) 12.8 % ointment   Topical   Apply topically as needed for dry skin.   56.7 g   0    BP  117/76  Pulse 80  Temp(Src) 97.8 F (36.6 C) (Oral)  Resp 16  SpO2 100%  LMP 10/11/2013 Physical Exam  Nursing note and vitals reviewed. Constitutional: She is oriented to person, place, and time. Vital signs are normal. She appears well-developed and well-nourished. No distress.  HENT:  Head: Normocephalic and atraumatic.  Pulmonary/Chest: Effort normal. No respiratory distress.  Abdominal: Soft. She exhibits no mass. There is tenderness (lower abdomen, worst in the left lower quadrant). There is no rebound, no guarding and no CVA tenderness.  Neurological: She is alert and oriented to person, place, and time. She has normal strength. Coordination normal.  Skin: Skin is warm and dry. No rash noted. She is not diaphoretic.  Psychiatric: She has a normal mood and affect. Judgment normal.    ED Course  Procedures (including critical care time) Labs Review Labs Reviewed  POCT URINALYSIS DIP (DEVICE) - Abnormal; Notable for the following:    Hgb urine dipstick MODERATE (*)    Protein, ur 100 (*)    Leukocytes, UA LARGE (*)    All other components within normal limits  URINE CULTURE   Imaging Review No results found.    MDM   1. UTI (lower urinary tract infection)    Culture sent. Treat with AZO and Keflex. Giving 1 dose of Suprax here because she may not be able to to fill antibiotic prescription until tomorrow. Followup when necessary   Meds ordered this encounter  Medications  . cephALEXin (KEFLEX) 500 MG capsule    Sig: Take 2 capsules (1,000 mg total) by mouth 2 (two) times daily.    Dispense:  20 capsule    Refill:  0    Order Specific Question:  Supervising Provider    Answer:  Linna HoffKINDL, JAMES D [phone][[id number]]  . phenazopyridine (PYRIDIUM) 200 MG tablet    Sig: Take 1 tablet (200 mg total) by mouth 3 (three) times daily.    Dispense:  6 tablet    Refill:  0    Order Specific Question:  Supervising Provider    Answer:  Linna HoffKINDL, JAMES D [phone][[id number]]  . cefixime (SUPRAX) tablet  400 mg    Sig:        Graylon GoodZachary H Sierah Lacewell, PA-C 10/21/13 1944

## 2013-10-21 NOTE — Discharge Instructions (Signed)
Urinary Tract Infection  Urinary tract infections (UTIs) can develop anywhere along your urinary tract. Your urinary tract is your body's drainage system for removing wastes and extra water. Your urinary tract includes two kidneys, two ureters, a bladder, and a urethra. Your kidneys are a pair of bean-shaped organs. Each kidney is about the size of your fist. They are located below your ribs, one on each side of your spine.  CAUSES  Infections are caused by microbes, which are microscopic organisms, including fungi, viruses, and bacteria. These organisms are so small that they can only be seen through a microscope. Bacteria are the microbes that most commonly cause UTIs.  SYMPTOMS   Symptoms of UTIs may vary by age and gender of the patient and by the location of the infection. Symptoms in young women typically include a frequent and intense urge to urinate and a painful, burning feeling in the bladder or urethra during urination. Older women and men are more likely to be tired, shaky, and weak and have muscle aches and abdominal pain. A fever may mean the infection is in your kidneys. Other symptoms of a kidney infection include pain in your back or sides below the ribs, nausea, and vomiting.  DIAGNOSIS  To diagnose a UTI, your caregiver will ask you about your symptoms. Your caregiver also will ask to provide a urine sample. The urine sample will be tested for bacteria and white blood cells. White blood cells are made by your body to help fight infection.  TREATMENT   Typically, UTIs can be treated with medication. Because most UTIs are caused by a bacterial infection, they usually can be treated with the use of antibiotics. The choice of antibiotic and length of treatment depend on your symptoms and the type of bacteria causing your infection.  HOME CARE INSTRUCTIONS   If you were prescribed antibiotics, take them exactly as your caregiver instructs you. Finish the medication even if you feel better after you  have only taken some of the medication.   Drink enough water and fluids to keep your urine clear or pale yellow.   Avoid caffeine, tea, and carbonated beverages. They tend to irritate your bladder.   Empty your bladder often. Avoid holding urine for long periods of time.   Empty your bladder before and after sexual intercourse.   After a bowel movement, women should cleanse from front to back. Use each tissue only once.  SEEK MEDICAL CARE IF:    You have back pain.   You develop a fever.   Your symptoms do not begin to resolve within 3 days.  SEEK IMMEDIATE MEDICAL CARE IF:    You have severe back pain or lower abdominal pain.   You develop chills.   You have nausea or vomiting.   You have continued burning or discomfort with urination.  MAKE SURE YOU:    Understand these instructions.   Will watch your condition.   Will get help right away if you are not doing well or get worse.  Document Released: 07/07/2005 Document Revised: 03/28/2012 Document Reviewed: 11/05/2011  [employer] Patient Information 2014 [employer]

## 2013-10-21 NOTE — ED Notes (Signed)
C/o UTI for five days  States she is having some burning when urinating, abd pain and has some discoloration in urinary appearance.  Denies odor States she has used OTC medication but no relief.

## 2013-10-22 NOTE — ED Provider Notes (Signed)
Medical screening examination/treatment/procedure(s) were performed by resident physician or non-physician practitioner and as supervising physician I was immediately available for consultation/collaboration.   Shylo Dillenbeck DOUGLAS MD.   Sinjin Amero D Rilda Bulls, MD 10/22/13 1802 

## 2013-10-23 ENCOUNTER — Ambulatory Visit: Payer: Self-pay | Admitting: Family Medicine

## 2013-10-23 LAB — URINE CULTURE: Colony Count: >=100000

## 2013-10-24 NOTE — ED Notes (Signed)
Urine culture: >100,000 colonies E. Coli.  Pt. adequately treated with Keflex. Vincenza Dail M 08/14/2014  

## 2014-02-03 ENCOUNTER — Encounter (HOSPITAL_COMMUNITY): Payer: Self-pay | Admitting: Emergency Medicine

## 2014-02-03 ENCOUNTER — Emergency Department (INDEPENDENT_AMBULATORY_CARE_PROVIDER_SITE_OTHER)
Admission: EM | Admit: 2014-02-03 | Discharge: 2014-02-03 | Disposition: A | Discharge status: Home / Self Care | Payer: Self-pay | Source: Home / Self Care

## 2014-02-03 DIAGNOSIS — N390 Urinary tract infection, site not specified: Secondary | ICD-10-CM

## 2014-02-03 LAB — POCT URINALYSIS DIP (DEVICE)
Bilirubin Urine: NEGATIVE
GLUCOSE, UA: NEGATIVE mg/dL
Ketones, ur: NEGATIVE mg/dL
NITRITE: NEGATIVE
PROTEIN: 100 mg/dL — AB
Specific Gravity, Urine: 1.015 (ref 1.005–1.030)
UROBILINOGEN UA: 0.2 mg/dL (ref 0.0–1.0)
pH: 6 (ref 5.0–8.0)

## 2014-02-03 MED ORDER — LIDOCAINE HCL (PF) 1 % IJ SOLN
INTRAMUSCULAR | Status: AC
  Filled 2014-02-03: qty 5

## 2014-02-03 MED ORDER — CEFTRIAXONE SODIUM 1 G IJ SOLR
INTRAMUSCULAR | Status: AC
  Filled 2014-02-03: qty 10

## 2014-02-03 MED ORDER — CEPHALEXIN 500 MG PO CAPS: 500.0000 mg | ORAL_CAPSULE | Freq: Four times a day (QID) | ORAL | Status: DC

## 2014-02-03 MED ORDER — CEFTRIAXONE SODIUM 1 G IJ SOLR
1.0000 g | Freq: Once | INTRAMUSCULAR | Status: AC
  Administered 2014-02-03: 1 g via INTRAMUSCULAR

## 2014-02-03 NOTE — Discharge Instructions (Signed)
Please follow up with your gynecologist about your frequent urinary tract infections.    Urinary Tract Infection Urinary tract infections (UTIs) can develop anywhere along your urinary tract. Your urinary tract is your body's drainage system for removing wastes and extra water. Your urinary tract includes two kidneys, two ureters, a bladder, and a urethra. Your kidneys are a pair of bean-shaped organs. Each kidney is about the size of your fist. They are located below your ribs, one on each side of your spine. CAUSES Infections are caused by microbes, which are microscopic organisms, including fungi, viruses, and bacteria. These organisms are so small that they can only be seen through a microscope. Bacteria are the microbes that most commonly cause UTIs. SYMPTOMS  Symptoms of UTIs may vary by age and gender of the patient and by the location of the infection. Symptoms in young women typically include a frequent and intense urge to urinate and a painful, burning feeling in the bladder or urethra during urination. Older women and men are more likely to be tired, shaky, and weak and have muscle aches and abdominal pain. A fever may mean the infection is in your kidneys. Other symptoms of a kidney infection include pain in your back or sides below the ribs, nausea, and vomiting. DIAGNOSIS To diagnose a UTI, your caregiver will ask you about your symptoms. Your caregiver also will ask to provide a urine sample. The urine sample will be tested for bacteria and white blood cells. White blood cells are made by your body to help fight infection. TREATMENT  Typically, UTIs can be treated with medication. Because most UTIs are caused by a bacterial infection, they usually can be treated with the use of antibiotics. The choice of antibiotic and length of treatment depend on your symptoms and the type of bacteria causing your infection. HOME CARE INSTRUCTIONS  If you were prescribed antibiotics, take them exactly  as your caregiver instructs you. Finish the medication even if you feel better after you have only taken some of the medication.  Drink enough water and fluids to keep your urine clear or pale yellow.  Avoid caffeine, tea, and carbonated beverages. They tend to irritate your bladder.  Empty your bladder often. Avoid holding urine for long periods of time.  Empty your bladder before and after sexual intercourse.  After a bowel movement, women should cleanse from front to back. Use each tissue only once. SEEK MEDICAL CARE IF:   You have back pain.  You develop a fever.  Your symptoms do not begin to resolve within 3 days. SEEK IMMEDIATE MEDICAL CARE IF:   You have severe back pain or lower abdominal pain.  You develop chills.  You have nausea or vomiting.  You have continued burning or discomfort with urination. MAKE SURE YOU:   Understand these instructions.  Will watch your condition.  Will get help right away if you are not doing well or get worse. Document Released: 07/07/2005 Document Revised: 03/28/2012 Document Reviewed: 11/05/2011 [facility] Patient Information 2014 [employer], [place].

## 2014-02-03 NOTE — ED Provider Notes (Signed)
CSN: [id number]     Arrival date & time 02/03/14  1643 History   None    Chief Complaint  Patient presents with  . Urinary Tract Infection   (Consider location/radiation/quality/duration/timing/severity/associated sxs/prior Treatment) HPI Comments: This is patient's 5th UTI this year. Had fever 4/24, none since. Pt reports is not sexually active.   Patient is a 48 y.o. female presenting with urinary tract infection. The history is provided by the patient.  Urinary Tract Infection This is a new problem. The current episode started 2 days ago. The problem occurs constantly. The problem has been gradually worsening. Associated symptoms include abdominal pain. Nothing aggravates the symptoms. Nothing relieves the symptoms. Treatments tried: cranberry pills and tylenol. The treatment provided no relief.    Past Medical History  Diagnosis Date  . Asthma   . HSV infection   . HPV (human papilloma virus) infection   . Vaginitis   . Asthma   . Varicose veins   . Mumps   . Measles   . Low iron   . Ovarian cyst   . GERD (gastroesophageal reflux disease)    Past Surgical History  Procedure Laterality Date  . Cesarean section    . Dilation and curettage of uterus     Family History  Problem Relation Age of Onset  . Dementia Mother    History  Substance Use Topics  . Smoking status: Never Smoker   . Smokeless tobacco: Never Used  . Alcohol Use: No   OB History   Grav Para Term Preterm Abortions TAB SAB Ect Mult Living   5 4             Review of Systems  Constitutional: Positive for fever. Negative for chills.  Gastrointestinal: Positive for nausea and abdominal pain. Negative for vomiting.  Genitourinary: Negative for flank pain and vaginal discharge.       Denies vaginal discharge or itching.     Allergies  Review of patient's allergies indicates no known allergies.  Home Medications   Prior to Admission medications   Medication Sig Start Date End Date Taking?  Authorizing Provider  citalopram (CELEXA) 20 MG tablet Take 20 mg by mouth daily.   Yes Historical Provider, MD  esomeprazole (NEXIUM) 40 MG capsule Take 1 capsule (40 mg total) by mouth daily. DISP Quant sufficient 3 months 04/28/11  Yes Barbaraann BarthelJames O Breen, MD  Fluticasone-Salmeterol (ADVAIR DISKUS) 250-50 MCG/DOSE AEPB Inhale 1 puff into the lungs every 12 (twelve) hours. DISP quantity sufficient 3 months 02/15/11  Yes Mayo AoSusan T Saxon, NP  norgestimate-ethinyl estradiol (ORTHO-CYCLEN,SPRINTEC,PREVIFEM) 0.25-35 MG-MCG tablet Take 1 tablet by mouth daily.   Yes Historical Provider, MD  OVER THE COUNTER MEDICATION Pain killers, cranberry juice   Yes Historical Provider, MD  buPROPion (WELLBUTRIN SR) 150 MG 12 hr tablet Take 1 tablet (150 mg total) by mouth 2 (two) times daily. 05/05/11 05/04/12  Barbaraann BarthelJames O Breen, MD  cephALEXin (KEFLEX) 500 MG capsule Take 1 capsule (500 mg total) by mouth 4 (four) times daily. 02/03/14   Cathlyn ParsonsAngela M Leno Mathes, NP   BP 125/75  Pulse 86  Temp(Src) 98.5 F (36.9 C) (Oral)  Resp 18  SpO2 97%  LMP 01/30/2014 Physical Exam  Constitutional: She appears well-developed and well-nourished. She does not appear ill. No distress.  Cardiovascular: Normal rate and regular rhythm.   Pulmonary/Chest: Effort normal and breath sounds normal.  Abdominal: Soft. Normal appearance and bowel sounds are normal. She exhibits no distension. There is tenderness in the suprapubic  area. There is no rigidity, no rebound, no guarding and no CVA tenderness.    ED Course  Procedures (including critical care time) Labs Review Labs Reviewed  POCT URINALYSIS DIP (DEVICE) - Abnormal; Notable for the following:    Hgb urine dipstick MODERATE (*)    Protein, ur 100 (*)    Leukocytes, UA LARGE (*)    All other components within normal limits  URINE CULTURE    Imaging Review No results found.   MDM   1. Urinary tract infection   pt to f/u with gyn for recurrent uti's. May need to see urology. Urine  culture sent. [facility].  Given 1gm IM.  Rx keflex 500mg  QID #28.      Cathlyn ParsonsAngela M Dechelle Attaway, NP 02/03/14 1839

## 2014-02-03 NOTE — ED Notes (Signed)
C/o fever 102 on Friday. Is having her period now. C/o nausea.  C/o pressure in her vagina.  Took cranberry pills and juice and Tylenol without relief.  Voiding frequently in small amounts.  No burning.  C/o a little bit of itching in her vaginal area also.

## 2014-02-05 LAB — URINE CULTURE

## 2014-02-05 NOTE — ED Notes (Signed)
Urine culture: >100,000 colonies E. Coli.  Pt. adequately treated with Keflex. Charlize Hathaway M Aubrey Blackard 02/05/2014  

## 2014-02-06 NOTE — ED Provider Notes (Signed)
Medical screening examination/treatment/procedure(s) were performed by a resident physician or non-physician practitioner and as the supervising physician I was immediately available for consultation/collaboration.  Evan Corey, MD    Evan S Corey, MD 02/06/14 0800 

## 2014-08-12 ENCOUNTER — Encounter (HOSPITAL_COMMUNITY): Payer: Self-pay | Admitting: Emergency Medicine

## 2014-12-19 ENCOUNTER — Emergency Department (INDEPENDENT_AMBULATORY_CARE_PROVIDER_SITE_OTHER)
Admission: EM | Admit: 2014-12-19 | Discharge: 2014-12-19 | Disposition: A | Discharge status: Nursing Home (Includes ICF) | Payer: Self-pay | Source: Home / Self Care | Attending: Family Medicine | Admitting: Family Medicine

## 2014-12-19 ENCOUNTER — Emergency Department (HOSPITAL_COMMUNITY)
Admission: EM | Admit: 2014-12-19 | Discharge: 2014-12-20 | Disposition: A | Discharge status: Home / Self Care | Payer: Self-pay | Attending: Emergency Medicine | Admitting: Emergency Medicine

## 2014-12-19 ENCOUNTER — Encounter (HOSPITAL_COMMUNITY): Payer: Self-pay | Admitting: Emergency Medicine

## 2014-12-19 ENCOUNTER — Emergency Department (HOSPITAL_COMMUNITY): Payer: Self-pay

## 2014-12-19 DIAGNOSIS — D72829 Elevated white blood cell count, unspecified: Secondary | ICD-10-CM | POA: Insufficient documentation

## 2014-12-19 DIAGNOSIS — Z792 Long term (current) use of antibiotics: Secondary | ICD-10-CM | POA: Insufficient documentation

## 2014-12-19 DIAGNOSIS — Z79899 Other long term (current) drug therapy: Secondary | ICD-10-CM | POA: Insufficient documentation

## 2014-12-19 DIAGNOSIS — J45909 Unspecified asthma, uncomplicated: Secondary | ICD-10-CM | POA: Insufficient documentation

## 2014-12-19 DIAGNOSIS — K219 Gastro-esophageal reflux disease without esophagitis: Secondary | ICD-10-CM | POA: Insufficient documentation

## 2014-12-19 DIAGNOSIS — Z8619 Personal history of other infectious and parasitic diseases: Secondary | ICD-10-CM | POA: Insufficient documentation

## 2014-12-19 DIAGNOSIS — Z8679 Personal history of other diseases of the circulatory system: Secondary | ICD-10-CM | POA: Insufficient documentation

## 2014-12-19 DIAGNOSIS — R1031 Right lower quadrant pain: Secondary | ICD-10-CM

## 2014-12-19 DIAGNOSIS — R110 Nausea: Secondary | ICD-10-CM

## 2014-12-19 DIAGNOSIS — R109 Unspecified abdominal pain: Secondary | ICD-10-CM

## 2014-12-19 DIAGNOSIS — Z8742 Personal history of other diseases of the female genital tract: Secondary | ICD-10-CM | POA: Insufficient documentation

## 2014-12-19 DIAGNOSIS — R339 Retention of urine, unspecified: Secondary | ICD-10-CM

## 2014-12-19 DIAGNOSIS — N390 Urinary tract infection, site not specified: Secondary | ICD-10-CM | POA: Insufficient documentation

## 2014-12-19 DIAGNOSIS — Z872 Personal history of diseases of the skin and subcutaneous tissue: Secondary | ICD-10-CM | POA: Insufficient documentation

## 2014-12-19 LAB — CBC WITH DIFFERENTIAL/PLATELET
Basophils Absolute: 0 10*3/uL (ref 0.0–0.1)
Basophils Relative: 0 % (ref 0–1)
Eosinophils Absolute: 0.2 10*3/uL (ref 0.0–0.7)
Eosinophils Relative: 1 % (ref 0–5)
HCT: 40.1 % (ref 36.0–46.0)
Hemoglobin: 12.7 g/dL (ref 12.0–15.0)
Lymphocytes Relative: 9 % — ABNORMAL LOW (ref 12–46)
Lymphs Abs: 1.6 10*3/uL (ref 0.7–4.0)
MCH: 25 pg — ABNORMAL LOW (ref 26.0–34.0)
MCHC: 31.7 g/dL (ref 30.0–36.0)
MCV: 78.8 fL (ref 78.0–100.0)
Monocytes Absolute: 0.5 10*3/uL (ref 0.1–1.0)
Monocytes Relative: 3 % (ref 3–12)
Neutro Abs: 15.7 10*3/uL — ABNORMAL HIGH (ref 1.7–7.7)
Neutrophils Relative %: 87 % — ABNORMAL HIGH (ref 43–77)
Platelets: 359 10*3/uL (ref 150–400)
RBC: 5.09 MIL/uL (ref 3.87–5.11)
RDW: 15.3 % (ref 11.5–15.5)
WBC: 18 10*3/uL — ABNORMAL HIGH (ref 4.0–10.5)

## 2014-12-19 LAB — POCT URINALYSIS DIP (DEVICE)
Bilirubin Urine: NEGATIVE
GLUCOSE, UA: NEGATIVE mg/dL
Ketones, ur: NEGATIVE mg/dL
Nitrite: POSITIVE — AB
SPECIFIC GRAVITY, URINE: 1.025 (ref 1.005–1.030)
Urobilinogen, UA: 0.2 mg/dL (ref 0.0–1.0)
pH: 7 (ref 5.0–8.0)

## 2014-12-19 LAB — POCT PREGNANCY, URINE: Preg Test, Ur: NEGATIVE

## 2014-12-19 MED ORDER — FENTANYL CITRATE 0.05 MG/ML IJ SOLN
50.0000 ug | Freq: Once | INTRAMUSCULAR | Status: AC
  Administered 2014-12-19: 50 ug via INTRAVENOUS
  Filled 2014-12-19: qty 2

## 2014-12-19 MED ORDER — IOHEXOL 300 MG/ML  SOLN
100.0000 mL | Freq: Once | INTRAMUSCULAR | Status: AC | PRN
  Administered 2014-12-19: 100 mL via INTRAVENOUS

## 2014-12-19 MED ORDER — ONDANSETRON HCL 4 MG/2ML IJ SOLN
4.0000 mg | Freq: Once | INTRAMUSCULAR | Status: DC
  Filled 2014-12-19: qty 2

## 2014-12-19 MED ORDER — HYDROMORPHONE HCL 1 MG/ML IJ SOLN
INTRAMUSCULAR | Status: AC
  Filled 2014-12-19: qty 1

## 2014-12-19 MED ORDER — IOHEXOL 300 MG/ML  SOLN
25.0000 mL | INTRAMUSCULAR | Status: AC
Start: 2014-12-19 — End: 2014-12-19
  Administered 2014-12-19: 25 mL via ORAL

## 2014-12-19 MED ORDER — SODIUM CHLORIDE 0.9 % IV BOLUS (SEPSIS)
1000.0000 mL | Freq: Once | INTRAVENOUS | Status: AC
  Administered 2014-12-19: 1000 mL via INTRAVENOUS

## 2014-12-19 MED ORDER — HYDROMORPHONE HCL 1 MG/ML IJ SOLN
1.0000 mg | Freq: Once | INTRAMUSCULAR | Status: AC
  Administered 2014-12-19: 1 mg via INTRAMUSCULAR

## 2014-12-19 MED ORDER — ONDANSETRON HCL 4 MG/2ML IJ SOLN
4.0000 mg | Freq: Once | INTRAMUSCULAR | Status: AC
  Administered 2014-12-19: 4 mg via INTRAMUSCULAR

## 2014-12-19 MED ORDER — METOCLOPRAMIDE HCL 5 MG/ML IJ SOLN
10.0000 mg | Freq: Once | INTRAMUSCULAR | Status: AC
  Administered 2014-12-19: 10 mg via INTRAVENOUS
  Filled 2014-12-19: qty 2

## 2014-12-19 MED ORDER — ONDANSETRON HCL 4 MG/2ML IJ SOLN
INTRAMUSCULAR | Status: AC
  Filled 2014-12-19: qty 2

## 2014-12-19 MED ORDER — SODIUM CHLORIDE 0.9 % IV SOLN
Freq: Once | INTRAVENOUS | Status: AC
  Administered 2014-12-19: 20:00:00 via INTRAVENOUS

## 2014-12-19 NOTE — ED Provider Notes (Signed)
CSN: [id number]     Arrival date & time 12/19/14  1708 History   First MD Initiated Contact with Patient 12/19/14 1905     No chief complaint on file.  (Consider location/radiation/quality/duration/timing/severity/associated sxs/prior Treatment) HPI         49 year old female presents complaining of a possible UTI. She has severe 10 out of 10 right lower quadrant and suprapubic abdominal pain, nausea, fever and chills. Her symptoms started 2 days ago and have gotten much worse today. She has not actually vomited but she feels like vomiting. She occasionally gets UTIs so she thinks that maybe what is going on today, although she has never had pain like this associated with UTI. She has a history of a hysterectomy but no other abdominal surgeries. She has to self catheterize twice daily   Past Medical History  Diagnosis Date  . Asthma   . HSV infection   . HPV (human papilloma virus) infection   . Vaginitis   . Asthma   . Varicose veins   . Mumps   . Measles   . Low iron   . Ovarian cyst   . GERD (gastroesophageal reflux disease)    Past Surgical History  Procedure Laterality Date  . Cesarean section    . Dilation and curettage of uterus     Family History  Problem Relation Age of Onset  . Dementia Mother    History  Substance Use Topics  . Smoking status: Never Smoker   . Smokeless tobacco: Never Used  . Alcohol Use: No   OB History    Gravida Para Term Preterm AB TAB SAB Ectopic Multiple Living   5 4             Review of Systems  Constitutional: Positive for fever and chills.  Gastrointestinal: Positive for nausea and abdominal pain.  Genitourinary: Positive for dysuria and difficulty urinating.  All other systems reviewed and are negative.   Allergies  Review of patient's allergies indicates no known allergies.  Home Medications   Prior to Admission medications   Medication Sig Start Date End Date Taking? Authorizing Provider  buPROPion (WELLBUTRIN SR) 150  MG 12 hr tablet Take 1 tablet (150 mg total) by mouth 2 (two) times daily. 05/05/11 05/04/12  Barbaraann BarthelJames O Breen, MD  cephALEXin (KEFLEX) 500 MG capsule Take 1 capsule (500 mg total) by mouth 4 (four) times daily. 02/03/14   Cathlyn ParsonsAngela M Kabbe, NP  citalopram (CELEXA) 20 MG tablet Take 20 mg by mouth daily.    Historical Provider, MD  esomeprazole (NEXIUM) 40 MG capsule Take 1 capsule (40 mg total) by mouth daily. DISP Quant sufficient 3 months 04/28/11   Barbaraann BarthelJames O Breen, MD  Fluticasone-Salmeterol (ADVAIR DISKUS) 250-50 MCG/DOSE AEPB Inhale 1 puff into the lungs every 12 (twelve) hours. DISP quantity sufficient 3 months 02/15/11   Mayo AoSusan T Saxon, NP  norgestimate-ethinyl estradiol (ORTHO-CYCLEN,SPRINTEC,PREVIFEM) 0.25-35 MG-MCG tablet Take 1 tablet by mouth daily.    Historical Provider, MD  OVER THE COUNTER MEDICATION Pain killers, cranberry juice    Historical Provider, MD   BP 137/77 mmHg  Pulse 95  Temp(Src) 97.7 F (36.5 C) (Oral)  Resp 20  SpO2 98% Physical Exam  Constitutional: She is oriented to person, place, and time. Vital signs are normal. She appears well-developed and well-nourished. She appears ill. She appears distressed.  In acute distress, crying and vomiting, clutching right lower quadrant of abdomen  HENT:  Head: Normocephalic and atraumatic.  Pulmonary/Chest: Effort normal. No  respiratory distress.  Abdominal: Soft. Normal appearance. There is tenderness in the right upper quadrant, right lower quadrant and suprapubic area. There is guarding and tenderness at McBurney's point. There is no CVA tenderness and negative Murphy's sign.  Positive psoas sign  Neurological: She is alert and oriented to person, place, and time. She has normal strength. Coordination normal.  Skin: Skin is warm and dry. No rash noted. She is not diaphoretic.  Psychiatric: She has a normal mood and affect. Judgment normal.  Nursing note and vitals reviewed.   ED Course  Procedures (including critical care  time) Labs Review Labs Reviewed  POCT URINALYSIS DIP (DEVICE) - Abnormal; Notable for the following:    Hgb urine dipstick LARGE (*)    Protein, ur >=300 (*)    Nitrite POSITIVE (*)    Leukocytes, UA LARGE (*)    All other components within normal limits  POCT PREGNANCY, URINE    Imaging Review No results found.   MDM   1. Right lower quadrant abdominal pain   2. Nausea    Her urinalysis suggests urinary tract infection, but her exam is more concerning for possible appendicitis. She will be given 4 mg Zofran IM as well as 1 mg Dilaudid IM here and transferred to the emergency department to rule out appendicitis  Meds ordered this encounter  Medications  . ondansetron (ZOFRAN) injection 4 mg    Sig:   . HYDROmorphone (DILAUDID) injection 1 mg    Sig:        Graylon Good, PA-C 12/19/14 1929

## 2014-12-19 NOTE — ED Notes (Signed)
Called Tiffany, PA-C to discuss plan of care. Patient does not want zofran, and request alternative med for nausea and some pain medication as well. She acknowledges, gives verbal order for 10 of reglan, and of fentanyl IV.

## 2014-12-19 NOTE — ED Notes (Signed)
Change in transfer plans, patient complaining of too much pain in lower pelvis, and now dizzy. diaphoretic Plan changed to transfer by ems.  Patient agreeable.

## 2014-12-19 NOTE — ED Notes (Signed)
Per [facility], patient is from urgent care with complaints of lower abdominal pain for the last few days, with dizziness, nausea and vomiting.  Reported to PT[facility] that UTI was positive at urgent care, sent here to assess severe abdominal pain. Patient currently rates pain 7/10. VS with ems: bp 107/63, p 85, o2 sat 98% on room air. zofran given prior to arrival.

## 2014-12-19 NOTE — ED Notes (Signed)
Patient attempted to void, only 20cc of cloudy urine output. Bladder scan showed 432 retained immediately after voiding. Lab called in regards to collection. Whitney acknowledged.

## 2014-12-19 NOTE — ED Notes (Signed)
Reported bladder scan results to Tiffany, PA-C, no new orders at this time.

## 2014-12-19 NOTE — ED Notes (Signed)
Lower abdominal pain for 2 days and is getting worse.  Patient appears to be in significant pain.  Patient is vomiting

## 2014-12-20 LAB — URINALYSIS, ROUTINE W REFLEX MICROSCOPIC
BILIRUBIN URINE: NEGATIVE
Glucose, UA: NEGATIVE mg/dL
KETONES UR: 15 mg/dL — AB
NITRITE: POSITIVE — AB
Protein, ur: >300 mg/dL — AB
SPECIFIC GRAVITY, URINE: 1.025 (ref 1.005–1.030)
UROBILINOGEN UA: 1 mg/dL (ref 0.0–1.0)
pH: 6 (ref 5.0–8.0)

## 2014-12-20 LAB — I-STAT CHEM 8, ED
BUN: 4 mg/dL — AB (ref 6–23)
CHLORIDE: 101 mmol/L (ref 96–112)
CREATININE: 0.7 mg/dL (ref 0.50–1.10)
Calcium, Ion: 1.14 mmol/L (ref 1.12–1.23)
GLUCOSE: 148 mg/dL — AB (ref 70–99)
HCT: 40 % (ref 36.0–46.0)
Hemoglobin: 13.6 g/dL (ref 12.0–15.0)
Potassium: 4.3 mmol/L (ref 3.5–5.1)
SODIUM: 137 mmol/L (ref 135–145)
TCO2: 25 mmol/L (ref 0–100)

## 2014-12-20 LAB — URINE MICROSCOPIC-ADD ON

## 2014-12-20 LAB — BASIC METABOLIC PANEL
Anion gap: 10 (ref 5–15)
BUN: 6 mg/dL (ref 6–23)
CHLORIDE: 102 mmol/L (ref 96–112)
CO2: 25 mmol/L (ref 19–32)
Calcium: 9 mg/dL (ref 8.4–10.5)
Creatinine, Ser: 0.74 mg/dL (ref 0.50–1.10)
GFR calc Af Amer: >90 mL/min (ref >90)
GFR calc non Af Amer: >90 mL/min (ref >90)
Glucose, Bld: 129 mg/dL — ABNORMAL HIGH (ref 70–99)
POTASSIUM: 4.1 mmol/L (ref 3.5–5.1)
Sodium: 137 mmol/L (ref 135–145)

## 2014-12-20 LAB — I-STAT CG4 LACTIC ACID, ED: Lactic Acid, Venous: 1.71 mmol/L (ref 0.5–2.0)

## 2014-12-20 MED ORDER — METOCLOPRAMIDE HCL 10 MG PO TABS: 10.0000 mg | ORAL_TABLET | Freq: Four times a day (QID) | ORAL | Status: DC

## 2014-12-20 MED ORDER — URIBEL 118 MG PO CAPS: 1.0000 | ORAL_CAPSULE | Freq: Four times a day (QID) | ORAL | Status: DC

## 2014-12-20 MED ORDER — NITROFURANTOIN MONOHYD MACRO 100 MG PO CAPS
100.0000 mg | ORAL_CAPSULE | Freq: Once | ORAL | Status: AC
  Administered 2014-12-20: 100 mg via ORAL
  Filled 2014-12-20: qty 1

## 2014-12-20 MED ORDER — NITROFURANTOIN MONOHYD MACRO 100 MG PO CAPS: 100.0000 mg | ORAL_CAPSULE | Freq: Two times a day (BID) | ORAL | Status: DC

## 2014-12-20 MED ORDER — DEXTROSE 5 % IV SOLN
1.0000 g | Freq: Once | INTRAVENOUS | Status: DC
  Filled 2014-12-20: qty 10

## 2014-12-20 MED ORDER — OXYCODONE-ACETAMINOPHEN 5-325 MG PO TABS: 1.0000 | ORAL_TABLET | Freq: Four times a day (QID) | ORAL | Status: DC | PRN

## 2014-12-20 NOTE — Discharge Instructions (Signed)

## 2014-12-20 NOTE — ED Provider Notes (Signed)
CSN: [id number]     Arrival date & time 12/19/14  2025 History   First MD Initiated Contact with Patient 12/19/14 2042     Chief Complaint  Patient presents with  . Abdominal Pain     (Consider location/radiation/quality/duration/timing/severity/associated sxs/prior Treatment) HPI    PCP: Barbaraann BarthelBREEN,JAMES O, MD  Urologist: Dr. Julien GirtNessi  Blood pressure 119/61, pulse 78, temperature 97.8 F (36.6 C), temperature source Oral, resp. rate 14, height 5\' 5"  (1.651 m), weight 177 lb (80.287 kg), last menstrual period 12/05/2014, SpO2 99 %.  Emma Rhodes is a 49 y.o.female with a significant PMH of asthma, GERD, HPV, vaginitis, asthma, varicose veins, mumps, measles, low iron, GERD, ovarian cyst presents to the ER by PTAR from Urgent Care for evaluation of lower abdominal pain. She has a known UTI from the Urgent Care but due to her lower right quadrant pain it was felt that she needed to go the ER for abdominal CT scan.  The patient tells me she knows the pain is from her bladder. She describes a recent diagnosis of urinary retention and diagnosis of bladder wall thickening of unknown origin. She is now self caths at home due to retention. Sometimes she has between [phone] mL retained in her bladder. The Urologist plans to due a urodynamic study soon but it has not yet been done.  Negative Review of Symptoms: No fevers, nausea, vomiting, diarrhea, back pain, weakness, confusion, fatigue, flank pain, myalgias.   Past Medical History  Diagnosis Date  . Asthma   . HSV infection   . HPV (human papilloma virus) infection   . Vaginitis   . Asthma   . Varicose veins   . Mumps   . Measles   . Low iron   . Ovarian cyst   . GERD (gastroesophageal reflux disease)    Past Surgical History  Procedure Laterality Date  . Cesarean section    . Dilation and curettage of uterus     Family History  Problem Relation Age of Onset  . Dementia Mother    History  Substance Use Topics  . Smoking status:  Never Smoker   . Smokeless tobacco: Never Used  . Alcohol Use: No   OB History    Gravida Para Term Preterm AB TAB SAB Ectopic Multiple Living   5 4             Review of Systems  10 Systems reviewed and are negative for acute change except as noted in the HPI.   Allergies  Review of patient's allergies indicates no known allergies.  Home Medications   Prior to Admission medications   Medication Sig Start Date End Date Taking? Authorizing Provider  acetaminophen (TYLENOL) 500 MG tablet Take 500 mg by mouth every 6 (six) hours as needed for moderate pain.    Yes Historical Provider, MD  buPROPion (WELLBUTRIN SR) 150 MG 12 hr tablet Take 1 tablet (150 mg total) by mouth 2 (two) times daily. 05/05/11 12/19/14 Yes Barbaraann BarthelJames O Breen, MD  esomeprazole (NEXIUM) 40 MG capsule Take 1 capsule (40 mg total) by mouth daily. DISP Quant sufficient 3 months 04/28/11  Yes Barbaraann BarthelJames O Breen, MD  Fluticasone-Salmeterol (ADVAIR DISKUS) 250-50 MCG/DOSE AEPB Inhale 1 puff into the lungs every 12 (twelve) hours. DISP quantity sufficient 3 months 02/15/11  Yes Mayo AoSusan T Saxon, NP  norgestimate-ethinyl estradiol (ORTHO-CYCLEN,SPRINTEC,PREVIFEM) 0.25-35 MG-MCG tablet Take 1 tablet by mouth daily.   Yes Historical Provider, MD  cephALEXin (KEFLEX) 500 MG capsule Take 1 capsule (  500 mg total) by mouth 4 (four) times daily. 02/03/14   Cathlyn Parsons, NP  Meth-Hyo-M Salley Hews Phos-Ph Sal (URIBEL) 118 MG CAPS Take 1 capsule (118 mg total) by mouth 4 (four) times daily. 12/20/14   Duvall Comes Neva Seat, PA-C  metoCLOPramide (REGLAN) 10 MG tablet Take 1 tablet (10 mg total) by mouth every 6 (six) hours. 12/20/14   Jamarquis Crull Neva Seat, PA-C  nitrofurantoin, macrocrystal-monohydrate, (MACROBID) 100 MG capsule Take 1 capsule (100 mg total) by mouth 2 (two) times daily. 12/20/14   Marlon Pel, PA-C  oxyCODONE-acetaminophen (PERCOCET/ROXICET) 5-325 MG per tablet Take 1 tablet by mouth every 6 (six) hours as needed for severe pain. 12/20/14   Eulla Kochanowski  Neva Seat, PA-C   BP 119/70 mmHg  Pulse 72  Temp(Src) 97.8 F (36.6 C) (Oral)  Resp 24  Ht  (1.651 m)  Wt 177 lb (80.287 kg)  BMI 29.45 kg/m2  SpO2 98%  LMP 12/05/2014 (Exact Date) Physical Exam  Constitutional: She appears well-developed and well-nourished. No distress.  HENT:  Head: Normocephalic and atraumatic.  Eyes: Pupils are equal, round, and reactive to light.  Neck: Normal range of motion. Neck supple.  Cardiovascular: Normal rate and regular rhythm.   Pulmonary/Chest: Effort normal.  Abdominal: Soft. Bowel sounds are normal. She exhibits no distension and no fluid wave. There is tenderness in the suprapubic area. There is no rigidity, no rebound, no guarding and no CVA tenderness.  Neurological: She is alert.  Skin: Skin is warm and dry.  Nursing note and vitals reviewed.   ED Course  Procedures (including critical care time) Labs Review Labs Reviewed  CBC WITH DIFFERENTIAL/PLATELET - Abnormal; Notable for the following:    WBC 18.0 (*)    MCH 25.0 (*)    Neutrophils Relative % 87 (*)    Neutro Abs 15.7 (*)    Lymphocytes Relative 9 (*)    All other components within normal limits  BASIC METABOLIC PANEL - Abnormal; Notable for the following:    Glucose, Bld 129 (*)    All other components within normal limits  URINALYSIS, ROUTINE W REFLEX MICROSCOPIC - Abnormal; Notable for the following:    Color, Urine AMBER (*)    APPearance TURBID (*)    Hgb urine dipstick LARGE (*)    Ketones, ur 15 (*)    Protein, ur >300 (*)    Nitrite POSITIVE (*)    Leukocytes, UA LARGE (*)    All other components within normal limits  URINE MICROSCOPIC-ADD ON - Abnormal; Notable for the following:    Bacteria, UA FEW (*)    All other components within normal limits  URINE CULTURE  POC URINE PREG, ED  I-STAT CHEM 8, ED    Imaging Review Ct Abdomen Pelvis W Contrast  12/20/2014   CLINICAL DATA:  Initial evaluation for acute right lower quadrant abdominal pain with nausea  and vomiting, worse over last 2 days.  EXAM: CT ABDOMEN AND PELVIS WITH CONTRAST  TECHNIQUE: Multidetector CT imaging of the abdomen and pelvis was performed using the standard protocol following bolus administration of intravenous contrast.  CONTRAST:  OMNIPAQUE IOHEXOL 300 MG/ML  SOLN  COMPARISON:  None.  FINDINGS: Mild atelectasis seen dependently within the visualized lung bases. No pleuropericardial effusion.  Geographic hypodensity seen within the liver most compatible with geographic fat deposition. There is a superimposed 13 mm hyper enhancing lesion within the posterior margin of the right hepatic lobe (series 201, image 28), indeterminate, but may represent focal fatty sparing versus  possible hemangioma.  Gallbladder within normal limits. Common bile duct at the upper limits of normal at 6 mm. Mild prominence of the intrahepatic biliary ducts centrally. Spleen, adrenal glands, and pancreas are within normal limits.  Kidneys are equal in size with symmetric enhancement. No nephrolithiasis, hydronephrosis, or focal enhancing renal mass. Subcentimeter hypodensity within the inferior left kidney too small the characterize, but statistically likely represents a small cyst.  Stomach within normal limits. No evidence for bowel obstruction. Appendix well visualized in the right lower quadrant and is of normal caliber and appearance without associated inflammatory changes to suggest acute appendicitis. There is intraluminal fluid density within the terminal ileum without significant inflammatory changes. Colon within normal limits.  Bladder is markedly distended with extensive circumferential wall thickening and mucosal enhancement. Wall thickening measures up to 2 cm. While this finding may be related to chronic outlet obstruction, possible acute cystitis could also have this appearance.  Uterus and ovaries within normal limits. Probable small nabothian cyst noted near the cervix.  No free air or fluid. No  pathologically enlarged intra-abdominal pelvic lymph nodes. Normal intravascular enhancement seen throughout the intra-abdominal aorta and its branch vessels.  No acute osseous abnormality. No worrisome lytic or blastic osseous lesions. 6 mm lucent lesion with sclerotic margin within the right iliac wing noted, of doubtful clinical significance.  IMPRESSION: 1. Marked dilatation of the bladder with prominent circumferential wall thickening up to 2 cm in diameter. While this finding may be related to chronic outlet obstruction, possible acute cystitis could also be considered. Correlation with urinalysis recommended. 2. No other acute intra-abdominal or pelvic process identified. 3. Normal appendix. 4. Geographic fat deposition within the liver as above.   Electronically Signed   By: Rise Mu M.D.   On: 12/20/2014 00:17     EKG Interpretation None      MDM   Final diagnoses:  Abdominal pain  UTI (lower urinary tract infection)  Urinary retention    CT scan shows acute cystitis and of signs of obstructive outlet syndrome. She also has UTI and elevated WBC. Since being in the ER she has not had any nausea or vomiting. She denies having dizziness after the pain medication wears off. The patient has a urologist that she has established care with and plans to get a urodynamic test done by. Her urinalysis shows ketones, > 300 protein, positive nitrites, leuks and hgb.   I spoke with Dr. Janifer Adie regarding this pt. He recommends starting her on Macrobid to start and Uribel for pain control. The Uribel can change the urine blue. He recommends she follow-up with urology. The patient has normal vital signs in the ER and is not displaying s/sx of systemic illness in the ED. - pt advised to call Dr. Julien Girt to arrange f/u.  49 y.o.Emma Rhodes's evaluation in the Emergency Department is complete. It has been determined that no acute conditions requiring further emergency intervention are present  at this time. The patient/guardian have been advised of the diagnosis and plan. We have discussed signs and symptoms that warrant return to the ED, such as changes or worsening in symptoms.  Vital signs are stable at discharge. Filed Vitals:   12/20/14 0045  BP: 119/70  Pulse: 72  Temp:   Resp: 24    Patient/guardian has voiced understanding and agreed to follow-up with the PCP or specialist.   Prior to discharge it was noted that the patients BMP had not yet resulted. The lab was called and the  machine is not working. The patient feels as though she cannot wait for the machine to work and is requesting to go home.    Marlon Pel, PA-C 12/20/14 0127  Tilden Fossa, MD 12/24/14 [phone]

## 2014-12-22 LAB — URINE CULTURE: Colony Count: 50000

## 2014-12-24 ENCOUNTER — Telehealth (HOSPITAL_BASED_OUTPATIENT_CLINIC_OR_DEPARTMENT_OTHER): Payer: Self-pay | Admitting: Emergency Medicine

## 2014-12-24 NOTE — Telephone Encounter (Signed)
Post ED Visit - Positive Culture Follow-up  Culture report reviewed by antimicrobial stewardship pharmacist: []  Wes Dulaney, Pharm.D., BCPS [x]  Celedonio MiyamotoJeremy Frens, Pharm.D., BCPS []  Georgina PillionElizabeth Martin, [address] BCPS []  PinkMinh Pham, [address]Ph[address]., BCPS, AAHIVP []  Estella HuskMichelle Turner, Pharm.D., BCPS, AAHIVP []  Elder CyphersLorie Poole, [address] BCPS  Positive urine culture Citrobacter Treated with uribel, nitrofurantoin, organism sensitive to the same and no further patient follow-up is required at this time.  Berle MullMiller, Shemekia Patane 12/24/2014, 12:18 PM

## 2015-02-28 ENCOUNTER — Ambulatory Visit: Payer: Self-pay | Admitting: Gynecology

## 2015-03-25 IMAGING — CT CT ABD-PELV W/ CM
2 of 5 series · 9 of 46 positions shown, 10 images · IV contrast (Iodine)
Comparison: None.

CLINICAL DATA: Initial evaluation for acute right lower quadrant
abdominal pain with nausea and vomiting, worse over last 2 days.

EXAM:
CT ABDOMEN AND PELVIS WITH CONTRAST
TECHNIQUE: Multidetector CT imaging of the abdomen and pelvis was performed
using the standard protocol following bolus administration of
intravenous contrast.
CONTRAST:  100mL OMNIPAQUE IOHEXOL 300 MG/ML  SOLN

[Series 201: routine, idose (2) · axial · 0.78mm/px · z∈[+81,+456]mm · 6 of 99 slices shown, 7 images]
[im 12/99  soft-tissue]
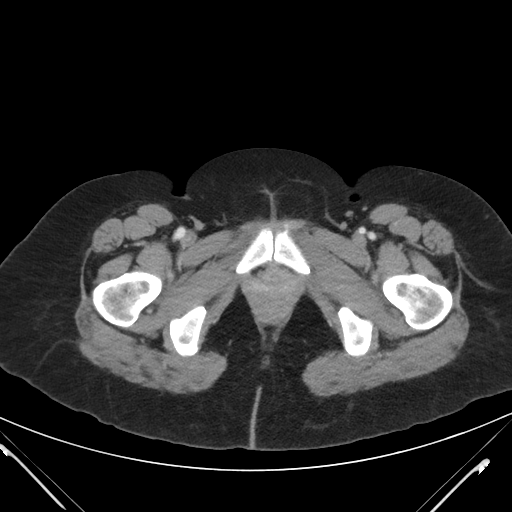
[im 12/99  bone]
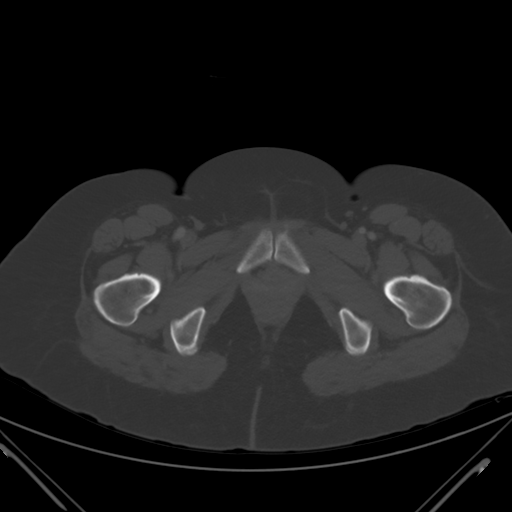
[im 29/99  soft-tissue]
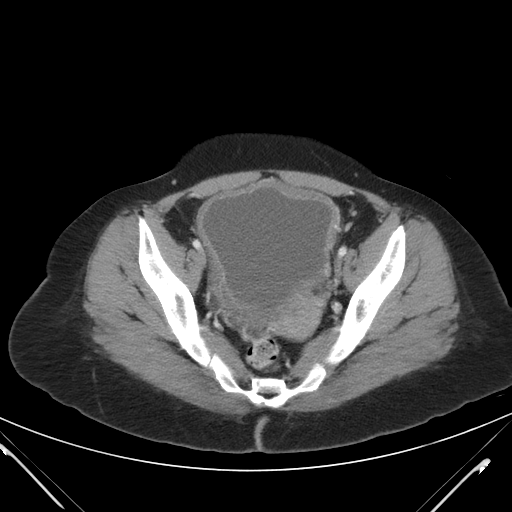
[im 41/99  soft-tissue]
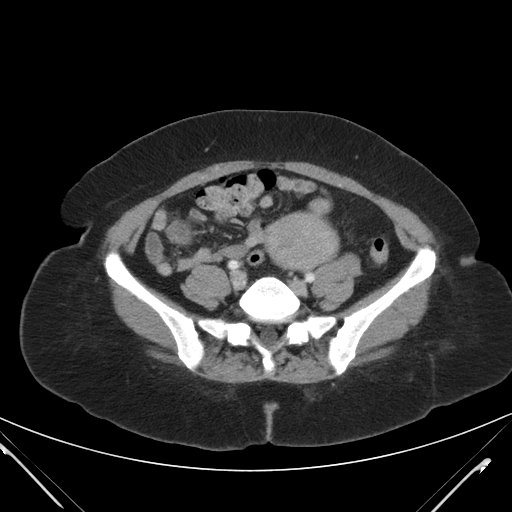
[im 58/99  soft-tissue]
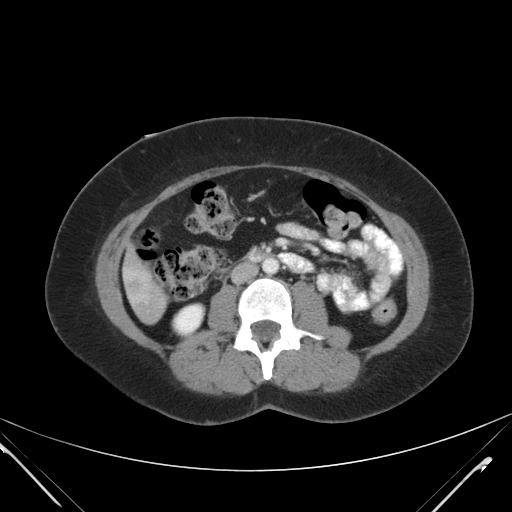
[im 75/99  soft-tissue]
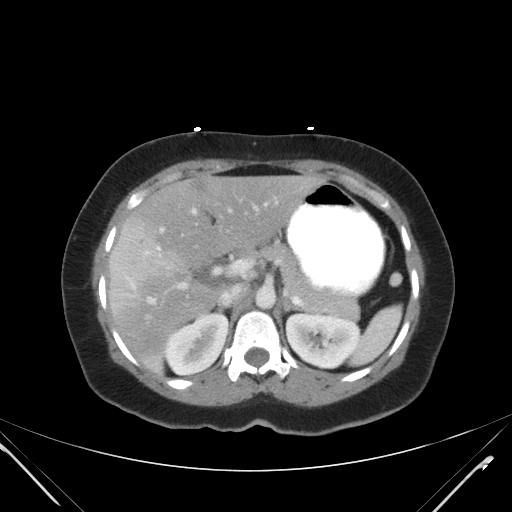
[im 87/99  soft-tissue]
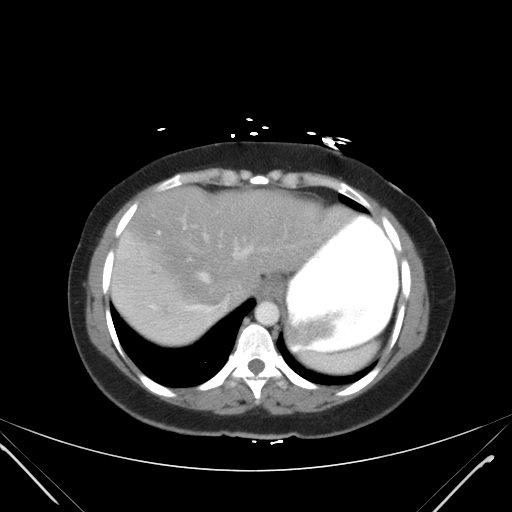

[Series 202: coronals, idose (2) · coronal · 0.45mm/px · 3 of 102 slices shown]
[im 34/102  soft-tissue]
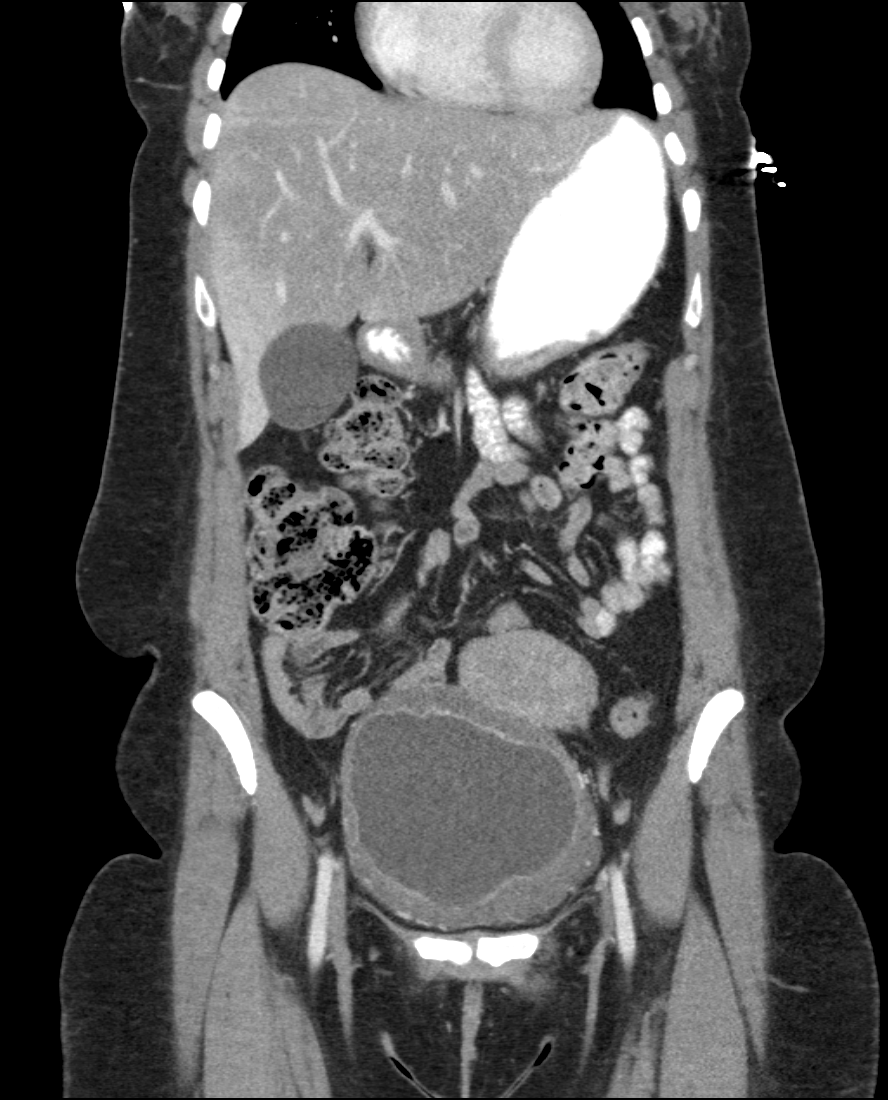
[im 45/102  soft-tissue]
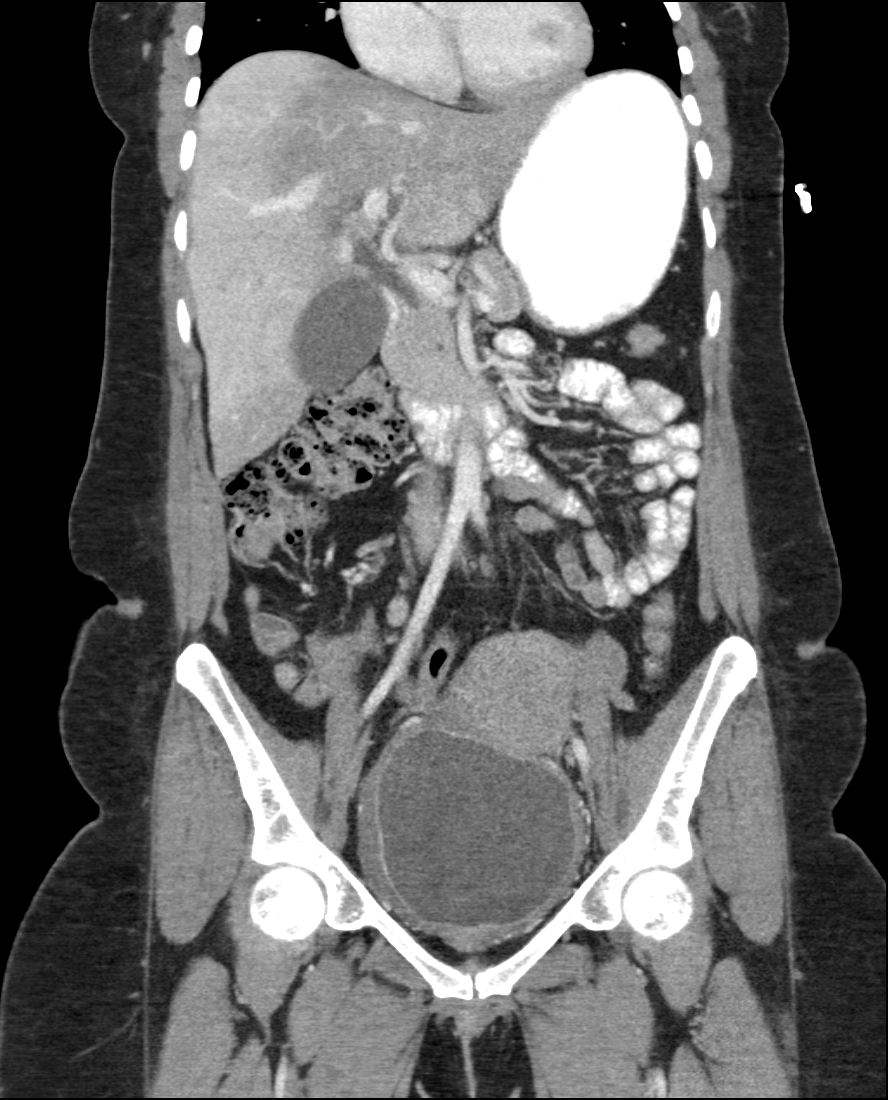
[im 57/102  soft-tissue]
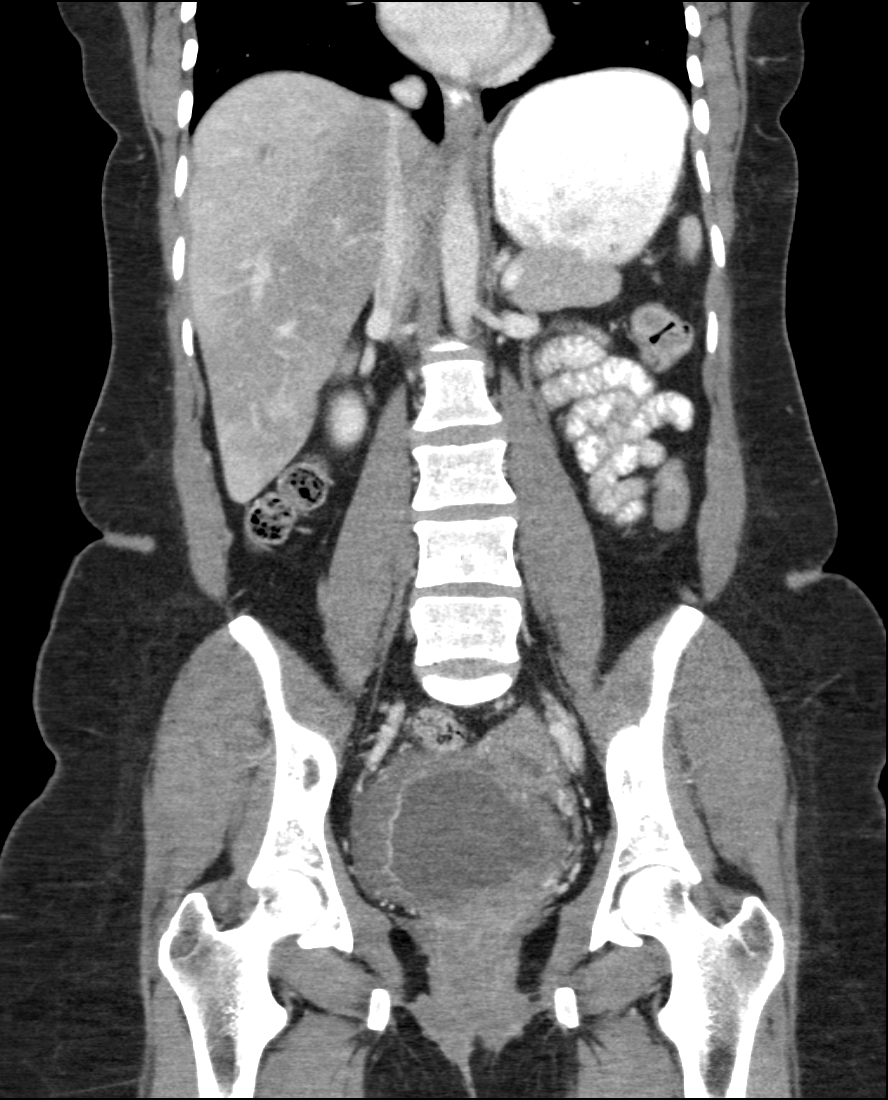

[9 of 46 positions shown; findings below may reference images not displayed]

FINDINGS: Mild atelectasis seen dependently within the visualized lung bases.
No pleuropericardial effusion.

Geographic hypodensity seen within the liver most compatible with
geographic fat deposition. There is a superimposed 13 mm hyper
enhancing lesion within the posterior margin of the right hepatic
lobe (series 201, image 28), indeterminate, but may represent focal
fatty sparing versus possible hemangioma.

Gallbladder within normal limits. Common bile duct at the upper
limits of normal at 6 mm. Mild prominence of the intrahepatic
biliary ducts centrally. Spleen, adrenal glands, and pancreas are
within normal limits.

Kidneys are equal in size with symmetric enhancement. No
nephrolithiasis, hydronephrosis, or focal enhancing renal mass.
Subcentimeter hypodensity within the inferior left kidney too small
the characterize, but statistically likely represents a small cyst.

Stomach within normal limits. No evidence for bowel obstruction.
Appendix well visualized in the right lower quadrant and is of
normal caliber and appearance without associated inflammatory
changes to suggest acute appendicitis. There is intraluminal fluid
density within the terminal ileum without significant inflammatory
changes. Colon within normal limits.

Bladder is markedly distended with extensive circumferential wall
thickening and mucosal enhancement. Wall thickening measures up to 2
cm. While this finding may be related to chronic outlet obstruction,
possible acute cystitis could also have this appearance.

Uterus and ovaries within normal limits. Probable small nabothian
cyst noted near the cervix.

No free air or fluid. No pathologically enlarged intra-abdominal
pelvic lymph nodes. Normal intravascular enhancement seen throughout
the intra-abdominal aorta and its branch vessels.

No acute osseous abnormality. No worrisome lytic or blastic osseous
lesions. 6 mm lucent lesion with sclerotic margin within the right
iliac wing noted, of doubtful clinical significance.
IMPRESSION: 1. Marked dilatation of the bladder with prominent circumferential
wall thickening up to 2 cm in diameter. While this finding may be
related to chronic outlet obstruction, possible acute cystitis could
also be considered. Correlation with urinalysis recommended.
2. No other acute intra-abdominal or pelvic process identified.
3. Normal appendix.
4. Geographic fat deposition within the liver as above.

## 2015-11-25 ENCOUNTER — Other Ambulatory Visit (INDEPENDENT_AMBULATORY_CARE_PROVIDER_SITE_OTHER): Payer: BLUE CROSS/BLUE SHIELD

## 2015-11-25 ENCOUNTER — Ambulatory Visit (INDEPENDENT_AMBULATORY_CARE_PROVIDER_SITE_OTHER): Payer: BLUE CROSS/BLUE SHIELD | Admitting: Internal Medicine

## 2015-11-25 ENCOUNTER — Encounter: Payer: Self-pay | Admitting: Internal Medicine

## 2015-11-25 VITALS — BP 116/62 | HR 89 | Temp 98.4°F | Resp 16 | Ht 65.0 in | Wt 173.0 lb

## 2015-11-25 DIAGNOSIS — J4520 Mild intermittent asthma, uncomplicated: Secondary | ICD-10-CM

## 2015-11-25 DIAGNOSIS — Z0000 Encounter for general adult medical examination without abnormal findings: Secondary | ICD-10-CM

## 2015-11-25 DIAGNOSIS — F4323 Adjustment disorder with mixed anxiety and depressed mood: Secondary | ICD-10-CM | POA: Diagnosis not present

## 2015-11-25 DIAGNOSIS — M25561 Pain in right knee: Secondary | ICD-10-CM

## 2015-11-25 DIAGNOSIS — M25562 Pain in left knee: Secondary | ICD-10-CM

## 2015-11-25 DIAGNOSIS — K219 Gastro-esophageal reflux disease without esophagitis: Secondary | ICD-10-CM | POA: Diagnosis not present

## 2015-11-25 LAB — COMPREHENSIVE METABOLIC PANEL
ALBUMIN: 3.6 g/dL (ref 3.5–5.2)
ALK PHOS: 100 U/L (ref 39–117)
ALT: 12 U/L (ref 0–35)
AST: 11 U/L (ref 0–37)
BILIRUBIN TOTAL: 0.3 mg/dL (ref 0.2–1.2)
BUN: 8 mg/dL (ref 6–23)
CO2: 28 mEq/L (ref 19–32)
Calcium: 9.2 mg/dL (ref 8.4–10.5)
Chloride: 102 mEq/L (ref 96–112)
Creatinine, Ser: 0.74 mg/dL (ref 0.40–1.20)
GFR: 88.54 mL/min (ref >60.00)
Glucose, Bld: 122 mg/dL — ABNORMAL HIGH (ref 70–99)
POTASSIUM: 3.7 meq/L (ref 3.5–5.1)
SODIUM: 136 meq/L (ref 135–145)
TOTAL PROTEIN: 8 g/dL (ref 6.0–8.3)

## 2015-11-25 LAB — CBC
HEMATOCRIT: 36.8 % (ref 36.0–46.0)
HEMOGLOBIN: 12.1 g/dL (ref 12.0–15.0)
MCHC: 33 g/dL (ref 30.0–36.0)
MCV: 74.9 fl — ABNORMAL LOW (ref 78.0–100.0)
PLATELETS: 453 10*3/uL — AB (ref 150.0–400.0)
RBC: 4.91 Mil/uL (ref 3.87–5.11)
RDW: 16 % — AB (ref 11.5–15.5)
WBC: 7.5 10*3/uL (ref 4.0–10.5)

## 2015-11-25 LAB — LIPID PANEL
CHOLESTEROL: 228 mg/dL — AB (ref 0–200)
HDL: 53.1 mg/dL (ref >39.00)
LDL Cholesterol: 146 mg/dL — ABNORMAL HIGH (ref 0–99)
NonHDL: 175.36
Total CHOL/HDL Ratio: 4
Triglycerides: 148 mg/dL (ref 0.0–149.0)
VLDL: 29.6 mg/dL (ref 0.0–40.0)

## 2015-11-25 MED ORDER — FLUTICASONE-SALMETEROL 250-50 MCG/DOSE IN AEPB: 1.0000 | INHALATION_SPRAY | Freq: Two times a day (BID) | RESPIRATORY_TRACT | Status: DC

## 2015-11-25 MED ORDER — DULOXETINE HCL 30 MG PO CPEP: 30.0000 mg | ORAL_CAPSULE | Freq: Every day | ORAL | Status: DC

## 2015-11-25 MED ORDER — LEVOCETIRIZINE DIHYDROCHLORIDE 5 MG PO TABS: 5.0000 mg | ORAL_TABLET | Freq: Every evening | ORAL | Status: DC

## 2015-11-25 MED ORDER — ALBUTEROL SULFATE HFA 108 (90 BASE) MCG/ACT IN AERS: 2.0000 | INHALATION_SPRAY | Freq: Four times a day (QID) | RESPIRATORY_TRACT | Status: DC | PRN

## 2015-11-25 MED ORDER — ESOMEPRAZOLE MAGNESIUM 40 MG PO CPDR
40.0000 mg | DELAYED_RELEASE_CAPSULE | Freq: Every day | ORAL | Status: DC
Start: 2015-11-25 — End: 2017-01-13

## 2015-11-25 MED ORDER — DICLOFENAC SODIUM 75 MG PO TBEC: 75.0000 mg | DELAYED_RELEASE_TABLET | Freq: Two times a day (BID) | ORAL | Status: DC

## 2015-11-25 NOTE — Progress Notes (Signed)
Pre visit review using our clinic review tool, if applicable. No additional management support is needed unless otherwise documented below in the visit note. 

## 2015-11-25 NOTE — Assessment & Plan Note (Signed)
Rx for nexium to help with her symptoms as this has controlled them in the past. No indication for further work up at this time unless the nexium does not help with her symptoms then we can evaluate.

## 2015-11-25 NOTE — Patient Instructions (Addendum)
We have sent in the nexium and the advair. We have also sent in an albuterol inhaler which you can keep in case of breathing emergencies. Use the advair everyday and the albuterol only if you are having problems breathing.  We have sent in xyzal for the allergies to replace the over the counter medicine. Take 1 pill daily for the allergies.   We have sent in voltaren for the joints that you take twice daily for the joint pain to help with it. You can also use tylenol if needed for the joints.   We will check the blood work today and call you back with the results.   We have sent in cymbalta (duloxetine) for the mood if you think you need it.   Stress and Stress Management Stress is a normal reaction to life events. It is what you feel when life demands more than you are used to or more than you can handle. Some stress can be useful. For example, the stress reaction can help you catch the last bus of the day, study for a test, or meet a deadline at work. But stress that occurs too often or for too long can cause problems. It can affect your emotional health and interfere with relationships and normal daily activities. Too much stress can weaken your immune system and increase your risk for physical illness. If you already have a medical problem, stress can make it worse. CAUSES  All sorts of life events may cause stress. An event that causes stress for one person may not be stressful for another person. Major life events commonly cause stress. These may be positive or negative. Examples include losing your job, moving into a new home, getting married, having a baby, or losing a loved one. Less obvious life events may also cause stress, especially if they occur day after day or in combination. Examples include working long hours, driving in traffic, caring for children, being in debt, or being in a difficult relationship. SIGNS AND SYMPTOMS Stress may cause emotional symptoms including, the  following:  Anxiety. This is feeling worried, afraid, on edge, overwhelmed, or out of control.  Anger. This is feeling irritated or impatient.  Depression. This is feeling sad, down, helpless, or guilty.  Difficulty focusing, remembering, or making decisions. Stress may cause physical symptoms, including the following:   Aches and pains. These may affect your head, neck, back, stomach, or other areas of your body.  Tight muscles or clenched jaw.  Low energy or trouble sleeping. Stress may cause unhealthy behaviors, including the following:   Eating to feel better (overeating) or skipping meals.  Sleeping too little, too much, or both.  Working too much or putting off tasks (procrastination).  Smoking, drinking alcohol, or using drugs to feel better. DIAGNOSIS  Stress is diagnosed through an assessment by your health care provider. Your health care provider will ask questions about your symptoms and any stressful life events.Your health care provider will also ask about your medical history and may order blood tests or other tests. Certain medical conditions and medicine can cause physical symptoms similar to stress. Mental illness can cause emotional symptoms and unhealthy behaviors similar to stress. Your health care provider may refer you to a mental health professional for further evaluation.  TREATMENT  Stress management is the recommended treatment for stress.The goals of stress management are reducing stressful life events and coping with stress in healthy ways.  Techniques for reducing stressful life events include the following:  Stress identification. Self-monitor for stress and identify what causes stress for you. These skills may help you to avoid some stressful events.  Time management. Set your priorities, keep a calendar of events, and learn to say "no." These tools can help you avoid making too many commitments. Techniques for coping with stress include the  following:  Rethinking the problem. Try to think realistically about stressful events rather than ignoring them or overreacting. Try to find the positives in a stressful situation rather than focusing on the negatives.  Exercise. Physical exercise can release both physical and emotional tension. The key is to find a form of exercise you enjoy and do it regularly.  Relaxation techniques. These relax the body and mind. Examples include yoga, meditation, tai chi, biofeedback, deep breathing, progressive muscle relaxation, listening to music, being out in nature, journaling, and other hobbies. Again, the key is to find one or more that you enjoy and can do regularly.  Healthy lifestyle. Eat a balanced diet, get plenty of sleep, and do not smoke. Avoid using alcohol or drugs to relax.  Strong support network. Spend time with family, friends, or other people you enjoy being around.Express your feelings and talk things over with someone you trust. Counseling or talktherapy with a mental health professional may be helpful if you are having difficulty managing stress on your own. Medicine is typically not recommended for the treatment of stress.Talk to your health care provider if you think you need medicine for symptoms of stress. HOME CARE INSTRUCTIONS  Keep all follow-up visits as directed by your health care provider.  Take all medicines as directed by your health care provider. SEEK MEDICAL CARE IF:  Your symptoms get worse or you start having new symptoms.  You feel overwhelmed by your problems and can no longer manage them on your own. SEEK IMMEDIATE MEDICAL CARE IF:  You feel like hurting yourself or someone else.   This information is not intended to replace advice given to you by your health care provider. Make sure you discuss any questions you have with your health care provider.   Document Released: 03/23/2001 Document Revised: 10/18/2014 Document Reviewed: 05/22/2013 Elsevier  Interactive Patient Education [employer].

## 2015-11-25 NOTE — Assessment & Plan Note (Signed)
She is struggling with post-divorce tensions with her children caused by her ex-husband. She is working through her problems and will decide if she needs to start on cymbalta for her mood. Tried and failed celexa and wellbutrin in the past due to side effects or not working.

## 2015-11-25 NOTE — Assessment & Plan Note (Signed)
Rx for advair and for albuterol and taught to use correctly with advair daily and albuterol as needed. She was also prescribed singulair as her allergies guide her breathing symptoms.

## 2015-11-25 NOTE — Assessment & Plan Note (Signed)
Rx for voltaren and talked to her about the need for regular exercise to help with arthritis. No injury to suggest need for imaging today.

## 2015-11-25 NOTE — Progress Notes (Signed)
   Subjective:    Patient ID: Emma Rhodes, female    DOB: December 03, 1965, 50 y.o.   MRN: [id number]  HPI The patient is a 50 YO female coming in new for several concerns. Her first is her acid reflux. She has been out of nexium for several months now and taking the over the counter which is not as strong. She is having some pain and is bloating some. No vomiting. Denies chest tightness. Does not have these symptoms with the nexium. Next concern is her asthma. She generally takes advair to help keep her breathing good. She does not have albuterol inhaler at home. She has not been hospitalized ever for her breathing and it has been several years since she has needed any prednisone.   PMH, [facility], social history reviewed and updated.   Review of Systems  Constitutional: Negative for fever, chills, activity change, appetite change, fatigue and unexpected weight change.  HENT: Negative.   Eyes: Negative.   Respiratory: Negative for cough, chest tightness, shortness of breath and wheezing.   Cardiovascular: Negative for chest pain, palpitations and leg swelling.  Gastrointestinal: Positive for abdominal distention. Negative for nausea, abdominal pain, diarrhea and constipation.  Musculoskeletal: Positive for arthralgias. Negative for myalgias, back pain and gait problem.  Skin: Negative.   Neurological: Negative.   Psychiatric/Behavioral: Positive for sleep disturbance, dysphoric mood and decreased concentration. Negative for suicidal ideas and self-injury. The patient is not nervous/anxious.       Objective:   Physical Exam  Constitutional: She is oriented to person, place, and time. She appears well-developed and well-nourished.  HENT:  Head: Normocephalic and atraumatic.  Eyes: EOM are normal.  Neck: Normal range of motion.  Cardiovascular: Normal rate and regular rhythm.   Pulmonary/Chest: Effort normal and breath sounds normal. No respiratory distress. She has no wheezes. She has no rales.  She exhibits no tenderness.  Abdominal: Soft. Bowel sounds are normal. She exhibits no distension. There is no tenderness. There is no rebound.  Musculoskeletal: She exhibits no edema.  Lymphadenopathy:    She has no cervical adenopathy.  Neurological: She is alert and oriented to person, place, and time. Coordination normal.  Skin: Skin is warm and dry.  Psychiatric: She has a normal mood and affect.   Filed Vitals:   11/25/15 0912  BP: 116/62  Pulse: 89  Temp: 98.4 F (36.9 C)  TempSrc: Oral  Resp: 16  Height:  (1.651 m)  Weight: 173 lb (78.472 kg)  SpO2: 98%      Assessment & Plan:

## 2016-01-04 ENCOUNTER — Other Ambulatory Visit: Payer: Self-pay | Admitting: Family Medicine

## 2016-05-24 ENCOUNTER — Ambulatory Visit: Payer: BLUE CROSS/BLUE SHIELD | Admitting: Internal Medicine

## 2016-08-12 ENCOUNTER — Other Ambulatory Visit: Payer: Self-pay | Admitting: Internal Medicine

## 2016-09-24 ENCOUNTER — Ambulatory Visit: Payer: BLUE CROSS/BLUE SHIELD | Admitting: Internal Medicine

## 2016-09-24 DIAGNOSIS — Z0289 Encounter for other administrative examinations: Secondary | ICD-10-CM

## 2017-01-13 ENCOUNTER — Other Ambulatory Visit: Payer: Self-pay | Admitting: Internal Medicine

## 2017-01-18 ENCOUNTER — Encounter: Payer: Self-pay | Admitting: Internal Medicine

## 2017-01-18 ENCOUNTER — Ambulatory Visit (INDEPENDENT_AMBULATORY_CARE_PROVIDER_SITE_OTHER): Payer: BLUE CROSS/BLUE SHIELD | Admitting: Internal Medicine

## 2017-01-18 ENCOUNTER — Other Ambulatory Visit (INDEPENDENT_AMBULATORY_CARE_PROVIDER_SITE_OTHER): Payer: BLUE CROSS/BLUE SHIELD

## 2017-01-18 VITALS — BP 132/76 | HR 84 | Temp 98.2°F | Resp 12 | Ht 65.0 in | Wt 189.0 lb

## 2017-01-18 DIAGNOSIS — R1011 Right upper quadrant pain: Secondary | ICD-10-CM | POA: Diagnosis not present

## 2017-01-18 LAB — COMPREHENSIVE METABOLIC PANEL
ALT: 11 U/L (ref 0–35)
AST: 10 U/L (ref 0–37)
Albumin: 3.5 g/dL (ref 3.5–5.2)
Alkaline Phosphatase: 95 U/L (ref 39–117)
BUN: 8 mg/dL (ref 6–23)
CALCIUM: 9.4 mg/dL (ref 8.4–10.5)
CO2: 26 mEq/L (ref 19–32)
Chloride: 102 mEq/L (ref 96–112)
Creatinine, Ser: 0.73 mg/dL (ref 0.40–1.20)
GFR: 89.52 mL/min (ref >60.00)
GLUCOSE: 122 mg/dL — AB (ref 70–99)
Potassium: 3.7 mEq/L (ref 3.5–5.1)
Sodium: 135 mEq/L (ref 135–145)
Total Bilirubin: 0.3 mg/dL (ref 0.2–1.2)
Total Protein: 7.9 g/dL (ref 6.0–8.3)

## 2017-01-18 LAB — LIPASE: Lipase: 7 U/L — ABNORMAL LOW (ref 11.0–59.0)

## 2017-01-18 NOTE — Progress Notes (Signed)
Pre visit review using our clinic review tool, if applicable. No additional management support is needed unless otherwise documented below in the visit note. 

## 2017-01-18 NOTE — Patient Instructions (Signed)
We are checking the labs today and will get the ultrasound of the stomach.    

## 2017-01-18 NOTE — Assessment & Plan Note (Signed)
Ordered CMP and lipase and [place] complete abdomen. Can use tylenol for pain.

## 2017-01-18 NOTE — Progress Notes (Signed)
   Subjective:    Patient ID: Emma Rhodes, female    DOB: [birth year], 51 y.o.   MRN: [id number]  HPI The patient is a 51 YO female coming in for RUQ pain coming in for about 5 days. She is having pain in the RUQ which is not worsened with eating or foods. She started taking some nutritional supplement where she works about 4 months ago. No changes to diet or exercise. Had a small cold a couple of weeks ago but the pain is not worse with bending or twisting. Has tried tylenol for pain and this has helped some. No fevers or chills. No nausea or vomiting, no diarrhea or constipation.   Review of Systems  Constitutional: Negative for activity change, appetite change, fatigue, fever and unexpected weight change.  Respiratory: Negative.   Cardiovascular: Negative.   Gastrointestinal: Positive for abdominal pain. Negative for abdominal distention, anal bleeding, blood in stool, diarrhea, nausea, rectal pain and vomiting.  Musculoskeletal: Negative.   Skin: Negative.       Objective:   Physical Exam  Constitutional: She is oriented to person, place, and time. She appears well-developed and well-nourished.  HENT:  Head: Normocephalic and atraumatic.  Eyes: EOM are normal.  Cardiovascular: Normal rate and regular rhythm.   Pulmonary/Chest: Effort normal and breath sounds normal.  Abdominal: Soft. Bowel sounds are normal. She exhibits no distension and no mass. There is tenderness. There is no rebound and no guarding.  Tenderness RUQ, without radiation, rebound or guarding.   Neurological: She is alert and oriented to person, place, and time.  Skin: Skin is warm and dry.   Vitals:   01/18/17 1025  BP: 132/76  Pulse: 84  Resp: 12  Temp: 98.2 F (36.8 C)  TempSrc: Oral  SpO2: 100%  Weight: 189 lb (85.7 kg)  Height:  (1.651 m)      Assessment & Plan:

## 2017-01-25 ENCOUNTER — Other Ambulatory Visit: Payer: BLUE CROSS/BLUE SHIELD

## 2017-02-01 ENCOUNTER — Other Ambulatory Visit: Payer: BLUE CROSS/BLUE SHIELD

## 2017-02-08 ENCOUNTER — Other Ambulatory Visit: Payer: BLUE CROSS/BLUE SHIELD

## 2017-02-23 ENCOUNTER — Encounter: Payer: Self-pay | Admitting: Gynecology

## 2017-08-01 ENCOUNTER — Encounter: Payer: Self-pay | Admitting: Internal Medicine

## 2017-08-01 ENCOUNTER — Ambulatory Visit (INDEPENDENT_AMBULATORY_CARE_PROVIDER_SITE_OTHER): Payer: Self-pay | Admitting: Internal Medicine

## 2017-08-01 DIAGNOSIS — F4323 Adjustment disorder with mixed anxiety and depressed mood: Secondary | ICD-10-CM

## 2017-08-01 MED ORDER — ALPRAZOLAM 0.25 MG PO TABS: 0.2500 mg | ORAL_TABLET | Freq: Two times a day (BID) | ORAL | 0 refills | Status: DC | PRN

## 2017-08-01 MED ORDER — CITALOPRAM HYDROBROMIDE 20 MG PO TABS: 20.0000 mg | ORAL_TABLET | Freq: Every day | ORAL | 3 refills | Status: DC

## 2017-08-01 NOTE — Progress Notes (Signed)
   Subjective:    Patient ID: Emma Rhodes, female    DOB: April 30, 1966, 51 y.o.   MRN: [id number]  HPI The patient is a 51 YO female coming in for stress and adjustment problems at home. She is going through a divorce and her older son is causing a lot of problems and she is worried that she is going to have him go live with his father. She also has a younger child and she does not feel it is fair to them. She denies SI/HI. She is struggling to make time for herself. She is also under financial stress. Stress and worry. No panic attacks but feels panicky at times. Not sleeping well.   Review of Systems  Constitutional: Negative.   Respiratory: Negative for cough, chest tightness and shortness of breath.   Cardiovascular: Negative for chest pain, palpitations and leg swelling.  Gastrointestinal: Negative for abdominal distention, abdominal pain, constipation, diarrhea, nausea and vomiting.  Musculoskeletal: Negative.   Skin: Negative.   Neurological: Negative.   Psychiatric/Behavioral: Positive for decreased concentration, dysphoric mood and sleep disturbance. Negative for self-injury and suicidal ideas. The patient is nervous/anxious.       Objective:   Physical Exam  Constitutional: She is oriented to person, place, and time. She appears well-developed and well-nourished.  HENT:  Head: Normocephalic and atraumatic.  Eyes: EOM are normal.  Neck: Normal range of motion.  Cardiovascular: Normal rate and regular rhythm.   Pulmonary/Chest: Effort normal and breath sounds normal. No respiratory distress. She has no wheezes. She has no rales.  Abdominal: Soft.  Neurological: She is alert and oriented to person, place, and time. Coordination normal.  Psychiatric:  Some appropriate distress   Vitals:   08/01/17 1558  BP: 120/78  Pulse: 96  Temp: 98.6 F (37 C)  TempSrc: Oral  SpO2: 99%  Weight: 172 lb (78 kg)  Height: 5\' 5"  (1.651 m)      Assessment & Plan:

## 2017-08-01 NOTE — Patient Instructions (Addendum)
We have sent in the celexa (citalopram) to take 1 pill daily.   We have sent in a small supply of the xanax to use as needed for stress.    Stress and Stress Management Stress is a normal reaction to life events. It is what you feel when life demands more than you are used to or more than you can handle. Some stress can be useful. For example, the stress reaction can help you catch the last bus of the day, study for a test, or meet a deadline at work. But stress that occurs too often or for too long can cause problems. It can affect your emotional health and interfere with relationships and normal daily activities. Too much stress can weaken your immune system and increase your risk for physical illness. If you already have a medical problem, stress can make it worse. What are the causes? All sorts of life events may cause stress. An event that causes stress for one person may not be stressful for another person. Major life events commonly cause stress. These may be positive or negative. Examples include losing your job, moving into a new home, getting married, having a baby, or losing a loved one. Less obvious life events may also cause stress, especially if they occur day after day or in combination. Examples include working long hours, driving in traffic, caring for children, being in debt, or being in a difficult relationship. What are the signs or symptoms? Stress may cause emotional symptoms including, the following:  Anxiety. This is feeling worried, afraid, on edge, overwhelmed, or out of control.  Anger. This is feeling irritated or impatient.  Depression. This is feeling sad, down, helpless, or guilty.  Difficulty focusing, remembering, or making decisions.  Stress may cause physical symptoms, including the following:  Aches and pains. These may affect your head, neck, back, stomach, or other areas of your body.  Tight muscles or clenched jaw.  Low energy or trouble  sleeping.  Stress may cause unhealthy behaviors, including the following:  Eating to feel better (overeating) or skipping meals.  Sleeping too little, too much, or both.  Working too much or putting off tasks (procrastination).  Smoking, drinking alcohol, or using drugs to feel better.  How is this diagnosed? Stress is diagnosed through an assessment by your health care provider. Your health care provider will ask questions about your symptoms and any stressful life events.Your health care provider will also ask about your medical history and may order blood tests or other tests. Certain medical conditions and medicine can cause physical symptoms similar to stress. Mental illness can cause emotional symptoms and unhealthy behaviors similar to stress. Your health care provider may refer you to a mental health professional for further evaluation. How is this treated? Stress management is the recommended treatment for stress.The goals of stress management are reducing stressful life events and coping with stress in healthy ways. Techniques for reducing stressful life events include the following:  Stress identification. Self-monitor for stress and identify what causes stress for you. These skills may help you to avoid some stressful events.  Time management. Set your priorities, keep a calendar of events, and learn to say "no." These tools can help you avoid making too many commitments.  Techniques for coping with stress include the following:  Rethinking the problem. Try to think realistically about stressful events rather than ignoring them or overreacting. Try to find the positives in a stressful situation rather than focusing on the negatives.  Exercise. Physical exercise can release both physical and emotional tension. The key is to find a form of exercise you enjoy and do it regularly.  Relaxation techniques. These relax the body and mind. Examples include yoga, meditation, tai chi,  biofeedback, deep breathing, progressive muscle relaxation, listening to music, being out in nature, journaling, and other hobbies. Again, the key is to find one or more that you enjoy and can do regularly.  Healthy lifestyle. Eat a balanced diet, get plenty of sleep, and do not smoke. Avoid using alcohol or drugs to relax.  Strong support network. Spend time with family, friends, or other people you enjoy being around.Express your feelings and talk things over with someone you trust.  Counseling or talktherapy with a mental health professional may be helpful if you are having difficulty managing stress on your own. Medicine is typically not recommended for the treatment of stress.Talk to your health care provider if you think you need medicine for symptoms of stress. Follow these instructions at home:  Keep all follow-up visits as directed by your health care provider.  Take all medicines as directed by your health care provider. Contact a health care provider if:  Your symptoms get worse or you start having new symptoms.  You feel overwhelmed by your problems and can no longer manage them on your own. Get help right away if:  You feel like hurting yourself or someone else. This information is not intended to replace advice given to you by your health care provider. Make sure you discuss any questions you have with your health care provider. Document Released: 03/23/2001 Document Revised: 03/04/2016 Document Reviewed: 05/22/2013 Elsevier Interactive Patient Education  2017 [employer].

## 2017-08-02 NOTE — Assessment & Plan Note (Signed)
Rx for celexa and short term xanax until this will work.

## 2017-11-24 ENCOUNTER — Other Ambulatory Visit: Payer: Self-pay | Admitting: Internal Medicine

## 2017-12-26 ENCOUNTER — Encounter: Payer: Self-pay | Admitting: Family Medicine

## 2017-12-26 ENCOUNTER — Other Ambulatory Visit: Payer: Self-pay

## 2017-12-26 ENCOUNTER — Ambulatory Visit (INDEPENDENT_AMBULATORY_CARE_PROVIDER_SITE_OTHER): Payer: Self-pay | Admitting: Family Medicine

## 2017-12-26 VITALS — BP 110/70 | HR 79 | Temp 98.3°F | Ht 65.0 in | Wt 176.0 lb

## 2017-12-26 DIAGNOSIS — Z7689 Persons encountering health services in other specified circumstances: Secondary | ICD-10-CM

## 2017-12-26 DIAGNOSIS — M722 Plantar fascial fibromatosis: Secondary | ICD-10-CM

## 2017-12-26 MED ORDER — NORGESTIMATE-ETH ESTRADIOL 0.25-35 MG-MCG PO TABS: 1.0000 | ORAL_TABLET | Freq: Every day | ORAL | 1 refills | Status: DC

## 2017-12-26 MED ORDER — ESOMEPRAZOLE MAGNESIUM 40 MG PO CPDR: 40.0000 mg | DELAYED_RELEASE_CAPSULE | Freq: Every day | ORAL | 0 refills | Status: DC

## 2017-12-26 MED ORDER — FLUTICASONE-SALMETEROL 250-50 MCG/DOSE IN AEPB: INHALATION_SPRAY | RESPIRATORY_TRACT | 3 refills | Status: DC

## 2017-12-26 NOTE — Patient Instructions (Signed)
It was great seeing you today! We have addressed the following issues today  1. I will refer you to sports medicine for your plantar fascitis [facility]. 2. I refill the three medications you requested. 3. We will do blood work once you have your Orange card. 4. You can buy zyrte over the counter for your allergies.  If we did any lab work today, and the results require attention, either me or my nurse will get in touch with you. If everything is normal, you will get a letter in mail and a message via . If you don't hear from us in two weeks, please give us a call. Otherwise, we look forward to seeing you again at your next visit. If you have any questions or concerns before then, please call the clinic at [phone].  Please bring all your medications to every doctors visit  Sign up for My Chart to have easy access to your labs results, and communication with your Primary care physician. Please ask Front Desk for some assistance.   Please check-out at the front desk before leaving the clinic.    Take Care,   Dr. Sydnee Cabaliallo

## 2017-12-26 NOTE — Progress Notes (Signed)
   Subjective:    Patient ID: Emma Rhodes, female    DOB: 1965-11-21, 52 y.o.   MRN: [id number]   CC: New patient establishing care   HPI: Patient is 52 yo female with a past medical history significant for asthma, plantar fascitis, GERD and anxiety who presents today to establish care. Patient currently has no acute complaints. She has been dealing with right foot pain for many years due to her plantar fascitis. She was seen on multiple occasions by specialist and has tried and failed multiple therapy. Patient is currently using ibuprofen for pain with minimal relief in her symptoms. Patient has ran out of her medications and has not been able to afford them because she does not currently have insurance. She has applied for the orange card and is awaiting approval. Patient denies any chest pain, SOB, abdominal pain, n/v, HA, vision changes.  Smoking status reviewed   ROS: all other systems were reviewed and are negative other than in the HPI   Past Medical History:  Diagnosis Date  . Asthma   . Asthma   . GERD (gastroesophageal reflux disease)   . HPV (human papilloma virus) infection   . HSV infection   . Low iron   . Measles   . Mumps   . Ovarian cyst   . Vaginitis   . Varicose veins     Past Surgical History:  Procedure Laterality Date  . CESAREAN SECTION    . DILATION AND CURETTAGE OF UTERUS      Past medical history, surgical, family, and social history reviewed and updated in the EMR as appropriate.  Objective:  BP 110/70   Pulse 79   Temp 98.3 F (36.8 C) (Oral)   Ht 5\' 5"  (1.651 m)   Wt 176 lb (79.8 kg)   SpO2 99%   BMI 29.29 kg/m   Vitals and nursing note reviewed  General: NAD, pleasant, able to participate in exam Cardiac: RRR, normal heart sounds, no murmurs. 2+ radial and PT pulses bilaterally Respiratory: CTAB, normal effort, No wheezes, rales or rhonchi Abdomen: soft, nontender, nondistended, no hepatic or splenomegaly, +BS Extremities: no  edema or cyanosis. WWP. Skin: warm and dry, no rashes noted Neuro: alert and oriented x4, no focal deficits Psych: Normal affect and mood   Assessment & Plan:   #Establishing care Patient is currently self pay and is awaiting orange card approval. She would like to hold off on blood work today until she is approve for assistance. Patient is also due for PAP, colonoscopy.  #Plantar fascitis, chronic Patient has a chronic history with multiple failed treatments. Patient needs a referral to sports medicine but will like to wait until she is approved for [employer]. Continue Ibuprofen on the as needed basis. Follow up after assistance approval.  Advair, Nexium and Sprintec refill printed. Patient will check on price. Discussed applying for MAP at the health department.    Lovena NeighboursAbdoulaye Irvin Lizama, MD [facility] PGY-2

## 2018-01-25 ENCOUNTER — Other Ambulatory Visit: Payer: Self-pay

## 2018-01-25 MED ORDER — NORGESTIMATE-ETH ESTRADIOL 0.25-35 MG-MCG PO TABS: 1.0000 | ORAL_TABLET | Freq: Every day | ORAL | 1 refills | Status: DC

## 2018-02-13 MED ORDER — NORGESTIMATE-ETH ESTRADIOL 0.25-35 MG-MCG PO TABS: 1.0000 | ORAL_TABLET | Freq: Every day | ORAL | 1 refills | Status: DC

## 2018-02-13 NOTE — Addendum Note (Signed)
Addended by: Henri Medal on: 02/13/2018 04:21 PM   Modules accepted: Orders

## 2018-02-13 NOTE — Telephone Encounter (Signed)
Resent script to pharmacy.  Jalyn Rosero,CMA

## 2018-03-16 ENCOUNTER — Other Ambulatory Visit: Payer: Self-pay | Admitting: Internal Medicine

## 2018-08-01 ENCOUNTER — Other Ambulatory Visit: Payer: Self-pay | Admitting: Internal Medicine

## 2018-10-13 ENCOUNTER — Encounter (HOSPITAL_COMMUNITY): Payer: Self-pay | Admitting: Emergency Medicine

## 2018-10-13 ENCOUNTER — Ambulatory Visit: Payer: Self-pay

## 2018-10-13 ENCOUNTER — Ambulatory Visit (HOSPITAL_COMMUNITY)
Admission: EM | Admit: 2018-10-13 | Discharge: 2018-10-13 | Disposition: A | Discharge status: Home / Self Care | Payer: BLUE CROSS/BLUE SHIELD | Attending: Family Medicine | Admitting: Family Medicine

## 2018-10-13 DIAGNOSIS — J3089 Other allergic rhinitis: Secondary | ICD-10-CM | POA: Insufficient documentation

## 2018-10-13 DIAGNOSIS — J4520 Mild intermittent asthma, uncomplicated: Secondary | ICD-10-CM

## 2018-10-13 MED ORDER — FLUTICASONE-SALMETEROL 250-50 MCG/DOSE IN AEPB: INHALATION_SPRAY | RESPIRATORY_TRACT | 1 refills | Status: DC

## 2018-10-13 NOTE — Telephone Encounter (Signed)
noted 

## 2018-10-13 NOTE — ED Provider Notes (Addendum)
[facility]   [id number] 10/13/18 Arrival Time: 1537  ASSESSMENT & PLAN:  1. Mild intermittent asthma, unspecified whether complicated   2. Non-seasonal allergic rhinitis, unspecified trigger    No respiratory distress this evening. No indication for chest imaging.  Meds ordered this encounter  Medications  . Fluticasone-Salmeterol (ADVAIR DISKUS) 250-50 MCG/DOSE AEPB    Sig: INHALE 1 PUFF INTO THE LUNGS EVERY 12 HOURS    Dispense:  180 each    Refill:  1   Has f/u with PCP on 9 January. Will keep appt.  Asthma precautions given. Has albuterol inhaler to use if needed. Will try OTC Claritin and Flonase for year-long allergy symptoms.  Follow-up Information    Lovena Neighboursiallo, Abdoulaye, MD.   Specialty:  Family Medicine Contact information: [address] [place] [phone]          Reviewed expectations re: course of current medical issues. Questions answered. Outlined signs and symptoms indicating need for more acute intervention. Patient verbalized understanding. After Visit Summary given.  SUBJECTIVE: History from: patient.  Emma Rhodes is a 53 y.o. female who presents with complaint of intermittent wheezing over the past couple of weeks. Has been out of Advair. Asthma usually well controlled when using Advair daily. Triggers: questions change in weather. Describes wheezing as mild to moderate when present. No current SOB or CP reported. Ambulatory without difficulty. Fever: no. Overall normal PO intake without n/v. Sick contacts: no. Inhaler use: albuterol prn with temporary relief. OTC treatment: none reported.  Also reports year-round allergy symptoms. Nasal congestion and sneezing. Would like recommendations as to treatment options.  Social History   Tobacco Use  Smoking Status Never Smoker  Smokeless Tobacco Never Used   ROS: As per HPI.   OBJECTIVE:  Vitals:   10/13/18 1618  BP: 128/62  Pulse: 89  Resp: 20  Temp: 98.2  F (36.8 C)  TempSrc: Oral  SpO2: 100%     General appearance: alert; appears fatigued HEENT: mild nasal congestion; oropharynx normal Neck: supple without LAD Cv: RRR without murmer Lungs: unlabored respirations, mild bilateral expiratory wheezing; cough: mild; no significant respiratory distress; speaks in full sentences without difficulty Skin: warm and dry Psychological: alert and cooperative; normal mood and affect   No Known Allergies  Past Medical History:  Diagnosis Date  . Asthma   . Asthma   . GERD (gastroesophageal reflux disease)   . HPV (human papilloma virus) infection   . HSV infection   . Low iron   . Measles   . Mumps   . Ovarian cyst   . Vaginitis   . Varicose veins    Family History  Problem Relation Age of Onset  . Dementia Mother    Social History   Socioeconomic History  . Marital status: Married    Spouse name: Not on file  . Number of children: Not on file  . Years of education: Not on file  . Highest education level: Not on file  Occupational History  . Not on file  Social Needs  . Financial resource strain: Not on file  . Food insecurity:    Worry: Not on file    Inability: Not on file  . Transportation needs:    Medical: Not on file    Non-medical: Not on file  Tobacco Use  . Smoking status: Never Smoker  . Smokeless tobacco: Never Used  Substance and Sexual Activity  . Alcohol use: No  . Drug use: No  .  Sexual activity: Yes    Birth control/protection: Pill  Lifestyle  . Physical activity:    Days per week: Not on file    Minutes per session: Not on file  . Stress: Not on file  Relationships  . Social connections:    Talks on phone: Not on file    Gets together: Not on file    Attends religious service: Not on file    Active member of club or organization: Not on file    Attends meetings of clubs or organizations: Not on file    Relationship status: Not on file  . Intimate partner violence:    Fear of current or ex  partner: Not on file    Emotionally abused: Not on file    Physically abused: Not on file    Forced sexual activity: Not on file  Other Topics Concern  . Not on file  Social History Narrative  . Not on file            Mardella Layman, MD 10/13/18 Rhona Leavens, MD 10/13/18 [phone]

## 2018-10-13 NOTE — ED Triage Notes (Signed)
Pt presents to [facility] for assessment of runny nose, eye and ear itching, cough, congestion and Asthma flare up.  States she has been out of Advair for two months and was sent here by her PCP

## 2018-10-13 NOTE — Telephone Encounter (Signed)
Pt with h/o asthma having wheezing and moderate shortness of breath. Symptoms began yesterday. Pt stated the SOB comes and goes. Pt having runny nose and cough. Pt stated she was out of Advair. Called PCP office and discussed with [facility] and agreed pt needs to go to [facility]. Care advice given and pt verbalized understanding.   Reason for Disposition . [1] MODERATE difficulty breathing (e.g., speaks in phrases, SOB even at rest, pulse 100-120) AND [2] NEW-onset or WORSE than normal  Answer Assessment - Initial Assessment Questions 1. RESPIRATORY STATUS: "Describe your breathing?" (e.g., wheezing, shortness of breath, unable to speak, severe coughing)      Wheezing , shortness of breath 2. ONSET: "When did this breathing problem begin?"      yesterday 3. PATTERN "Does the difficult breathing come and go, or has it been constant since it started?"      Comes and goes 4. SEVERITY: "How bad is your breathing?" (e.g., mild, moderate, severe)    - MILD: No SOB at rest, mild SOB with walking, speaks normally in sentences, can lay down, no retractions, pulse < 100.    - MODERATE: SOB at rest, SOB with minimal exertion and prefers to sit, cannot lie down flat, speaks in phrases, mild retractions, audible wheezing, pulse 100-120.    - SEVERE: Very SOB at rest, speaks in single words, struggling to breathe, sitting hunched forward, retractions, pulse > 120      moderate 5. RECURRENT SYMPTOM: "Have you had difficulty breathing before?" If so, ask: "When was the last time?" and "What happened that time?"      With cold or allergies takes advair and is out of Advair 6. CARDIAC HISTORY: "Do you have any history of heart disease?" (e.g., heart attack, angina, bypass surgery, angioplasty)      no 7. LUNG HISTORY: "Do you have any history of lung disease?"  (e.g., pulmonary embolus, asthma, emphysema)     asthma 8. CAUSE: "What do you think is causing the breathing problem?"      Cold symptoms and allergies 9. OTHER  SYMPTOMS: "Do you have any other symptoms? (e.g., dizziness, runny nose, cough, chest pain, fever)     Runny nose,occasional cough 10. PREGNANCY: "Is there any chance you are pregnant?" "When was your last menstrual period?"       n/a 11. TRAVEL: "Have you traveled out of the country in the last month?" (e.g., travel history, exposures)       no  Protocols used: BREATHING DIFFICULTY-A-AH

## 2018-10-19 ENCOUNTER — Ambulatory Visit: Payer: Self-pay | Admitting: Internal Medicine

## 2018-10-19 ENCOUNTER — Telehealth: Payer: Self-pay | Admitting: Internal Medicine

## 2018-10-19 NOTE — Telephone Encounter (Signed)
Pt would like to continue to be your patient, please advise if ok

## 2018-10-24 NOTE — Telephone Encounter (Signed)
Okay 

## 2018-10-26 ENCOUNTER — Ambulatory Visit: Payer: BLUE CROSS/BLUE SHIELD | Admitting: Nurse Practitioner

## 2018-10-26 NOTE — Telephone Encounter (Signed)
Pt has an appt with dr Okey Dupre on 11-08-2018

## 2018-10-27 ENCOUNTER — Encounter (HOSPITAL_COMMUNITY): Payer: Self-pay | Admitting: Emergency Medicine

## 2018-10-27 ENCOUNTER — Ambulatory Visit (HOSPITAL_COMMUNITY)
Admission: EM | Admit: 2018-10-27 | Discharge: 2018-10-27 | Disposition: A | Discharge status: Home / Self Care | Payer: BLUE CROSS/BLUE SHIELD | Attending: Internal Medicine | Admitting: Internal Medicine

## 2018-10-27 DIAGNOSIS — H7092 Unspecified mastoiditis, left ear: Secondary | ICD-10-CM | POA: Diagnosis not present

## 2018-10-27 MED ORDER — PREDNISONE 10 MG (21) PO TBPK: ORAL_TABLET | Freq: Every day | ORAL | 0 refills | Status: DC

## 2018-10-27 MED ORDER — AMOXICILLIN-POT CLAVULANATE 875-125 MG PO TABS: 1.0000 | ORAL_TABLET | Freq: Two times a day (BID) | ORAL | 0 refills | Status: DC

## 2018-10-27 MED ORDER — CETIRIZINE-PSEUDOEPHEDRINE ER 5-120 MG PO TB12: 1.0000 | ORAL_TABLET | Freq: Every day | ORAL | 0 refills | Status: DC

## 2018-10-27 MED ORDER — CEFTRIAXONE SODIUM 1 G IJ SOLR
INTRAMUSCULAR | Status: AC
  Filled 2018-10-27: qty 10

## 2018-10-27 MED ORDER — CEFTRIAXONE SODIUM 1 G IJ SOLR
1.0000 g | Freq: Once | INTRAMUSCULAR | Status: AC
  Administered 2018-10-27: 1 g via INTRAMUSCULAR

## 2018-10-27 NOTE — ED Provider Notes (Signed)
[facility]    CSN: [id number] Arrival date & time: 10/27/18  1717     History   Chief Complaint Chief Complaint  Patient presents with  . Otalgia  . Jaw Pain    HPI Emma Rhodes is a 53 y.o. female.   53 year old female with past medical history of depression, GERD and knee pain presents to urgent care complaining of pain in her left TMJ.  The patient has not had TMJ syndrome.  Symptoms began 3 days ago and have become gradually worse to the point that it hurts her to open her mouth to eat.  She denies fevers, nausea or vomiting.  However, she admits to facial tenderness around her nose as well as behind her left ear.     Past Medical History:  Diagnosis Date  . Asthma   . Asthma   . GERD (gastroesophageal reflux disease)   . HPV (human papilloma virus) infection   . HSV infection   . Low iron   . Measles   . Mumps   . Ovarian cyst   . Vaginitis   . Varicose veins     Patient Active Problem List   Diagnosis Date Noted  . RUQ pain 01/18/2017  . GERD (gastroesophageal reflux disease) 11/25/2015  . Knee pain, bilateral 05/05/2011  . IRREGULAR MENSTRUAL CYCLE 01/09/2010  . PLANTAR FASCIITIS, RIGHT 01/09/2010  . 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 Asthma, chronic 09/24/2009    Past Surgical History:  Procedure Laterality Date  . CESAREAN SECTION    . DILATION AND CURETTAGE OF UTERUS      OB History    Gravida  5   Para  4   Term      Preterm      AB      Living        SAB      TAB      Ectopic      Multiple      Live Births               Home Medications    Prior to Admission medications   Medication Sig Start Date End Date Taking? Authorizing Provider  acetaminophen (TYLENOL) 500 MG tablet Take 500 mg by mouth every 6 (six) hours as needed for moderate pain.     [provider]  ALPRAZolam Prudy Feeler) 0.25 MG tablet Take 1 tablet (0.25 mg total) by mouth 2 (two) times daily as needed  for anxiety. 08/01/17   Myrlene Broker, MD  amoxicillin-clavulanate (AUGMENTIN) 875-125 MG tablet Take 1 tablet by mouth every 12 (twelve) hours. 10/27/18   Arnaldo Natal, MD  cetirizine-pseudoephedrine (ZYRTEC-D) 5-120 MG tablet Take 1 tablet by mouth daily. 10/27/18   Arnaldo Natal, MD  citalopram (CELEXA) 20 MG tablet TAKE 1 TABLET BY MOUTH ONCE DAILY 03/17/18   Corwin Levins, MD  citalopram (CELEXA) 20 MG tablet TAKE 1 TABLET BY MOUTH ONCE DAILY 08/01/18   Diallo, Lilia Argue, MD  esomeprazole (NEXIUM) 40 MG capsule Take 1 capsule (40 mg total) by mouth daily. 12/26/17   Diallo, Lilia Argue, MD  Fluticasone-Salmeterol (ADVAIR DISKUS) 250-50 MCG/DOSE AEPB INHALE 1 PUFF INTO THE LUNGS EVERY 12 HOURS 10/13/18   Mardella Layman, MD  norgestimate-ethinyl estradiol (ORTHO-CYCLEN,SPRINTEC,PREVIFEM) 0.25-35 MG-MCG tablet Take 1 tablet by mouth daily. Reported on 11/25/2015 02/13/18   Lovena Neighbours, MD  predniSONE (STERAPRED UNI-PAK 21 TAB) 10 MG (21) TBPK tablet Take by mouth daily. Take  6 tabs by mouth daily  for 2 days, then 5 tabs for 2 days, then 4 tabs for 2 days, then 3 tabs for 2 days, 2 tabs for 2 days, then 1 tab by mouth daily for 2 days 10/27/18   Arnaldo Nataliamond, Joreen Swearingin S, MD  trimethoprim (TRIMPEX) 100 MG tablet Take 100 mg by mouth 2 (two) times daily.  11/22/15   [provider]    Family History Family History  Problem Relation Age of Onset  . Dementia Mother     Social History Social History   Tobacco Use  . Smoking status: Never Smoker  . Smokeless tobacco: Never Used  Substance Use Topics  . Alcohol use: No  . Drug use: No     Allergies   Patient has no known allergies.   Review of Systems Review of Systems  Constitutional: Negative for chills and fever.  HENT: Positive for sinus pressure, sinus pain and sneezing. Negative for sore throat and tinnitus.   Eyes: Negative for redness.  Respiratory: Negative for cough and shortness of breath.   Cardiovascular:  Negative for chest pain and palpitations.  Gastrointestinal: Negative for abdominal pain, diarrhea, nausea and vomiting.  Genitourinary: Negative for dysuria, frequency and urgency.  Musculoskeletal: Negative for myalgias.  Skin: Negative for rash.       No lesions  Neurological: Negative for weakness.  Hematological: Does not bruise/bleed easily.  Psychiatric/Behavioral: Negative for suicidal ideas.     Physical Exam Triage Vital Signs ED Triage Vitals  Enc Vitals Group     BP 10/27/18 1920 121/64     Pulse Rate 10/27/18 1918 84     Resp 10/27/18 1918 18     Temp 10/27/18 1918 98.1 F (36.7 C)     Temp src --      SpO2 10/27/18 1918 100 %     Weight --      Height --      Head Circumference --      Peak Flow --      Pain Score --      Pain Loc --      Pain Edu? --      Excl. in GC? --    No data found.  Updated Vital Signs BP 121/64   Pulse 84   Temp 98.1 F (36.7 C)   Resp 18   LMP 09/29/2018   SpO2 100%   Visual Acuity Right Eye Distance:   Left Eye Distance:   Bilateral Distance:    Right Eye Near:   Left Eye Near:    Bilateral Near:     Physical Exam Vitals signs and nursing note reviewed.  Constitutional:      General: She is not in acute distress.    Appearance: She is well-developed.  HENT:     Head: Normocephalic and atraumatic.   Eyes:     General: No scleral icterus.    Conjunctiva/sclera: Conjunctivae normal.     Pupils: Pupils are equal, round, and reactive to light.  Neck:     Musculoskeletal: Normal range of motion and neck supple.     Thyroid: No thyromegaly.     Vascular: No JVD.     Trachea: No tracheal deviation.  Cardiovascular:     Rate and Rhythm: Normal rate and regular rhythm.     Heart sounds: Normal heart sounds. No murmur. No friction rub. No gallop.   Pulmonary:     Effort: Pulmonary effort is normal.     Breath  sounds: Normal breath sounds.  Abdominal:     General: Bowel sounds are normal. There is no distension.      Palpations: Abdomen is soft.     Tenderness: There is no abdominal tenderness.  Musculoskeletal: Normal range of motion.  Lymphadenopathy:     Cervical: No cervical adenopathy.  Skin:    General: Skin is warm and dry.  Neurological:     Mental Status: She is alert and oriented to person, place, and time.     Cranial Nerves: No cranial nerve deficit.  Psychiatric:        Behavior: Behavior normal.        Thought Content: Thought content normal.        Judgment: Judgment normal.      UC Treatments / Results  Labs (all labs ordered are listed, but only abnormal results are displayed) Labs Reviewed - No data to display  EKG None  Radiology No results found.  Procedures Procedures (including critical care time)  Medications Ordered in UC Medications  cefTRIAXone (ROCEPHIN) injection 1 g (has no administration in time range)    Initial Impression / Assessment and Plan / UC Course  I have reviewed the triage vital signs and the nursing notes.  Pertinent labs & imaging results that were available during my care of the patient were reviewed by me and considered in my medical decision making (see chart for details).     Signs or symptoms sepsis or neurologic deficit.  No cranial crepitus.  IM ceftriaxone followed by p.o. Augmentin and prednisone.  Antihistamine for sinus congestion.  Return to emergency department for signs or symptoms of sepsis.  Final Clinical Impressions(s) / UC Diagnoses   Final diagnoses:  Mastoiditis of left side   Discharge Instructions   None    ED Prescriptions    Medication Sig Dispense Auth. Provider   amoxicillin-clavulanate (AUGMENTIN) 875-125 MG tablet Take 1 tablet by mouth every 12 (twelve) hours. 14 tablet Arnaldo Natal, MD   predniSONE (STERAPRED UNI-PAK 21 TAB) 10 MG (21) TBPK tablet Take by mouth daily. Take 6 tabs by mouth daily  for 2 days, then 5 tabs for 2 days, then 4 tabs for 2 days, then 3 tabs for 2 days, 2 tabs  for 2 days, then 1 tab by mouth daily for 2 days 42 tablet Arnaldo Natal, MD   cetirizine-pseudoephedrine (ZYRTEC-D) 5-120 MG tablet Take 1 tablet by mouth daily. 30 tablet Arnaldo Natal, MD     Controlled Substance Prescriptions  Controlled Substance Registry consulted? Not Applicable   Arnaldo Natal, MD 10/27/18 [place]

## 2018-10-27 NOTE — ED Triage Notes (Signed)
Pt states for two days shes had L ear pain and L jaw pain. Denies pain if shes not moving her jaw, painful chewing.

## 2018-11-08 ENCOUNTER — Ambulatory Visit: Payer: BLUE CROSS/BLUE SHIELD | Admitting: Internal Medicine

## 2018-11-08 ENCOUNTER — Encounter: Payer: Self-pay | Admitting: Internal Medicine

## 2018-11-21 ENCOUNTER — Telehealth (HOSPITAL_COMMUNITY): Payer: Self-pay | Admitting: Emergency Medicine

## 2018-11-21 MED ORDER — FLUCONAZOLE 150 MG PO TABS: 150.0000 mg | ORAL_TABLET | Freq: Once | ORAL | 0 refills | Status: AC

## 2018-11-21 NOTE — Telephone Encounter (Signed)
Pt called stating the amoxicillin and rocephin gave her a yeast infection. Pt had called and left VM several days ago. Instructed patient to take diflucan and to return if not improving.

## 2018-11-21 NOTE — Telephone Encounter (Signed)
Returned patients voicemail. No answer, no voicemail set up.

## 2020-03-25 ENCOUNTER — Telehealth: Payer: Self-pay | Admitting: General Practice

## 2020-03-25 ENCOUNTER — Encounter: Payer: BLUE CROSS/BLUE SHIELD | Admitting: Internal Medicine

## 2020-03-25 NOTE — Telephone Encounter (Signed)
Please advise of reestablishing care. She no longer has the [employer] she now has [employer].

## 2020-03-27 NOTE — Telephone Encounter (Signed)
Fine, no more than 1 new patient per day. Also appears she recently had physical with her [facility] provider so that is not due

## 2020-04-25 ENCOUNTER — Other Ambulatory Visit: Payer: Self-pay

## 2020-04-25 ENCOUNTER — Ambulatory Visit (HOSPITAL_COMMUNITY)
Admission: EM | Admit: 2020-04-25 | Discharge: 2020-04-25 | Disposition: A | Discharge status: Home / Self Care | Payer: 59 | Attending: Emergency Medicine | Admitting: Emergency Medicine

## 2020-04-25 ENCOUNTER — Encounter (HOSPITAL_COMMUNITY): Payer: Self-pay | Admitting: Emergency Medicine

## 2020-04-25 DIAGNOSIS — Z20822 Contact with and (suspected) exposure to covid-19: Secondary | ICD-10-CM | POA: Insufficient documentation

## 2020-04-25 LAB — SARS CORONAVIRUS 2 (TAT 6-24 HRS): SARS Coronavirus 2: NEGATIVE

## 2020-04-25 NOTE — ED Provider Notes (Signed)
[facility]    CSN: [id number] Arrival date & time: 04/25/20  1011      History   Chief Complaint Chief Complaint  Patient presents with  . covid test for travel    HPI Emma Rhodes is a 54 y.o. female.   Emma Rhodes presents with requests for covid-19 testing prior to travel. She is travelling to [place] on 7/18 and requires testing. No known exposures. She feels well and without any complaints. She is fully vaccinated against covid-19.        Past Medical History:  Diagnosis Date  . Asthma   . Asthma   . GERD (gastroesophageal reflux disease)   . HPV (human papilloma virus) infection   . HSV infection   . Low iron   . Measles   . Mumps   . Ovarian cyst   . Vaginitis   . Varicose veins     Patient Active Problem List   Diagnosis Date Noted  . RUQ pain 01/18/2017  . GERD (gastroesophageal reflux disease) 11/25/2015  . Knee pain, bilateral 05/05/2011  . IRREGULAR MENSTRUAL CYCLE 01/09/2010  . PLANTAR FASCIITIS, RIGHT 01/09/2010  . 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 Asthma, chronic 09/24/2009    Past Surgical History:  Procedure Laterality Date  . CESAREAN SECTION    . DILATION AND CURETTAGE OF UTERUS      OB History    Gravida  5   Para  4   Term      Preterm      AB      Living        SAB      TAB      Ectopic      Multiple      Live Births               Home Medications    Prior to Admission medications   Medication Sig Start Date End Date Taking? Authorizing Provider  acetaminophen (TYLENOL) 500 MG tablet Take 500 mg by mouth every 6 (six) hours as needed for moderate pain.     [provider]  ALPRAZolam Prudy Feeler) 0.25 MG tablet Take 1 tablet (0.25 mg total) by mouth 2 (two) times daily as needed for anxiety. 08/01/17   Myrlene Broker, MD  cetirizine-pseudoephedrine (ZYRTEC-D) 5-120 MG tablet Take 1 tablet by mouth daily. 10/27/18   Arnaldo Natal, MD    citalopram (CELEXA) 20 MG tablet TAKE 1 TABLET BY MOUTH ONCE DAILY 08/01/18   Diallo, Lilia Argue, MD  esomeprazole (NEXIUM) 40 MG capsule Take 1 capsule (40 mg total) by mouth daily. 12/26/17   Diallo, Lilia Argue, MD  Fluticasone-Salmeterol (ADVAIR DISKUS) 250-50 MCG/DOSE AEPB INHALE 1 PUFF INTO THE LUNGS EVERY 12 HOURS 10/13/18   Mardella Layman, MD  norgestimate-ethinyl estradiol (ORTHO-CYCLEN,SPRINTEC,PREVIFEM) 0.25-35 MG-MCG tablet Take 1 tablet by mouth daily. Reported on 11/25/2015 02/13/18   Lovena Neighbours, MD  predniSONE (STERAPRED UNI-PAK 21 TAB) 10 MG (21) TBPK tablet Take by mouth daily. Take 6 tabs by mouth daily  for 2 days, then 5 tabs for 2 days, then 4 tabs for 2 days, then 3 tabs for 2 days, 2 tabs for 2 days, then 1 tab by mouth daily for 2 days 10/27/18   Arnaldo Natal, MD  trimethoprim (TRIMPEX) 100 MG tablet Take 100 mg by mouth 2 (two) times daily.  11/22/15   [provider]    Family History Family History  Problem  Relation Age of Onset  . Dementia Mother     Social History Social History   Tobacco Use  . Smoking status: Never Smoker  . Smokeless tobacco: Never Used  Substance Use Topics  . Alcohol use: No  . Drug use: No     Allergies   Patient has no known allergies.   Review of Systems Review of Systems   Physical Exam Triage Vital Signs ED Triage Vitals [04/25/20 1108]  Enc Vitals Group     BP 125/75     Pulse Rate 81     Resp 18     Temp 97.9 F (36.6 C)     Temp Source Oral     SpO2 99 %     Weight      Height      Head Circumference      Peak Flow      Pain Score 0     Pain Loc      Pain Edu?      Excl. in GC?    No data found.  Updated Vital Signs BP 125/75 (BP Location: Right Arm)   Pulse 81   Temp 97.9 F (36.6 C) (Oral)   Resp 18   SpO2 99%   Visual Acuity Right Eye Distance:   Left Eye Distance:   Bilateral Distance:    Right Eye Near:   Left Eye Near:    Bilateral Near:     Physical  Exam Constitutional:      General: She is not in acute distress.    Appearance: She is well-developed.  Cardiovascular:     Rate and Rhythm: Normal rate.  Pulmonary:     Effort: Pulmonary effort is normal.  Skin:    General: Skin is warm and dry.  Neurological:     Mental Status: She is alert and oriented to person, place, and time.      UC Treatments / Results  Labs (all labs ordered are listed, but only abnormal results are displayed) Labs Reviewed  SARS CORONAVIRUS 2 (TAT 6-24 HRS)    EKG   Radiology No results found.  Procedures Procedures (including critical care time)  Medications Ordered in UC Medications - No data to display  Initial Impression / Assessment and Plan / UC Course  I have reviewed the triage vital signs and the nursing notes.  Pertinent labs & imaging results that were available during my care of the patient were reviewed by me and considered in my medical decision making (see chart for details).     covid-19 testing collected and pending.  Final Clinical Impressions(s) / UC Diagnoses   Final diagnoses:  Encounter for laboratory testing for COVID-19 virus     Discharge Instructions     We will notify of you any positive findings. If normal or otherwise without concern to your results, we will not call you. Please log on to your MyChart to review your results if interested in so.     ED Prescriptions    None     PDMP not reviewed this encounter.   Georgetta Haber, NP 04/25/20 1300

## 2020-04-25 NOTE — ED Triage Notes (Signed)
Pt here for covid test for travel on 7/18

## 2020-04-25 NOTE — Discharge Instructions (Signed)
We will notify of you any positive findings. If normal or otherwise without concern to your results, we will not call you. Please log on to your MyChart to review your results if interested in so.

## 2020-09-12 ENCOUNTER — Ambulatory Visit (HOSPITAL_COMMUNITY)
Admission: EM | Admit: 2020-09-12 | Discharge: 2020-09-12 | Disposition: A | Discharge status: Home / Self Care | Payer: 59 | Attending: Family Medicine | Admitting: Family Medicine

## 2020-09-12 ENCOUNTER — Encounter (HOSPITAL_COMMUNITY): Payer: Self-pay | Admitting: Emergency Medicine

## 2020-09-12 ENCOUNTER — Other Ambulatory Visit: Payer: Self-pay

## 2020-09-12 DIAGNOSIS — H6993 Unspecified Eustachian tube disorder, bilateral: Secondary | ICD-10-CM

## 2020-09-12 DIAGNOSIS — H6983 Other specified disorders of Eustachian tube, bilateral: Secondary | ICD-10-CM

## 2020-09-12 DIAGNOSIS — R21 Rash and other nonspecific skin eruption: Secondary | ICD-10-CM

## 2020-09-12 DIAGNOSIS — Z760 Encounter for issue of repeat prescription: Secondary | ICD-10-CM

## 2020-09-12 DIAGNOSIS — T7840XA Allergy, unspecified, initial encounter: Secondary | ICD-10-CM

## 2020-09-12 MED ORDER — ROSUVASTATIN CALCIUM 10 MG PO TABS: 10.0000 mg | ORAL_TABLET | Freq: Every day | ORAL | 1 refills | Status: DC

## 2020-09-12 MED ORDER — PREDNISONE 10 MG (21) PO TBPK: ORAL_TABLET | ORAL | 0 refills | Status: DC

## 2020-09-12 MED ORDER — FLUTICASONE PROPIONATE 50 MCG/ACT NA SUSP: 1.0000 | Freq: Every day | NASAL | 2 refills | Status: DC

## 2020-09-12 MED ORDER — CETIRIZINE HCL 10 MG PO TABS: 10.0000 mg | ORAL_TABLET | Freq: Every day | ORAL | 0 refills | Status: DC

## 2020-09-12 MED ORDER — CITALOPRAM HYDROBROMIDE 20 MG PO TABS: 20.0000 mg | ORAL_TABLET | Freq: Every day | ORAL | 1 refills | Status: DC

## 2020-09-12 NOTE — Discharge Instructions (Addendum)
Treating for allergies, eustachian tube dysfunction and rash.  Take the medication as prescribed.  Refilled  medications as requested.  I have put to contact on your discharge instructions for primary care for follow-up

## 2020-09-12 NOTE — ED Triage Notes (Signed)
Patient presents to [facility] for assessment of 5 days of sinus congestion, mild cough, facial rash.  States Benadryl helps, but makes he too sleepy to function.

## 2020-09-15 NOTE — ED Provider Notes (Signed)
[facility]    CSN: [id number] Arrival date & time: 09/12/20  1116      History   Chief Complaint Chief Complaint  Patient presents with  . Sore Throat    HPI Emma Rhodes is a 54 y.o. female.   Patient is a 54 year old female past medical history of asthma, GERD.  She presents today with 5 days of sinus congestion, mild cough, facial rash. She has been taking benadryl with some relief but this makes her drowsy. Also having itchy  Ears, and eyes.  No fever, chills, body aches, chest congestion. Also requesting refill on daily meds.      Past Medical History:  Diagnosis Date  . Asthma   . Asthma   . GERD (gastroesophageal reflux disease)   . HPV (human papilloma virus) infection   . HSV infection   . Low iron   . Measles   . Mumps   . Ovarian cyst   . Vaginitis   . Varicose veins     Patient Active Problem List   Diagnosis Date Noted  . RUQ pain 01/18/2017  . GERD (gastroesophageal reflux disease) 11/25/2015  . Knee pain, bilateral 05/05/2011  . IRREGULAR MENSTRUAL CYCLE 01/09/2010  . PLANTAR FASCIITIS, RIGHT 01/09/2010  . 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 Asthma, chronic 09/24/2009    Past Surgical History:  Procedure Laterality Date  . CESAREAN SECTION    . DILATION AND CURETTAGE OF UTERUS      OB History    Gravida  5   Para  4   Term      Preterm      AB      Living        SAB      TAB      Ectopic      Multiple      Live Births               Home Medications    Prior to Admission medications   Medication Sig Start Date End Date Taking? Authorizing Provider  acetaminophen (TYLENOL) 500 MG tablet Take 500 mg by mouth every 6 (six) hours as needed for moderate pain.     [provider]  ALPRAZolam Prudy Feeler) 0.25 MG tablet Take 1 tablet (0.25 mg total) by mouth 2 (two) times daily as needed for anxiety. 08/01/17   Myrlene Broker, MD  cetirizine (ZYRTEC) 10 MG tablet Take  1 tablet (10 mg total) by mouth daily. 09/12/20   Dahlia Byes A, NP  citalopram (CELEXA) 20 MG tablet Take 1 tablet (20 mg total) by mouth daily. 09/12/20   Dahlia Byes A, NP  esomeprazole (NEXIUM) 40 MG capsule Take 1 capsule (40 mg total) by mouth daily. 12/26/17   Diallo, Lilia Argue, MD  fluticasone (FLONASE) 50 MCG/ACT nasal spray Place 1 spray into both nostrils daily. 09/12/20   Jovonne Wilton, Gloris Manchester A, NP  Fluticasone-Salmeterol (ADVAIR DISKUS) 250-50 MCG/DOSE AEPB INHALE 1 PUFF INTO THE LUNGS EVERY 12 HOURS 10/13/18   Mardella Layman, MD  norgestimate-ethinyl estradiol (ORTHO-CYCLEN,SPRINTEC,PREVIFEM) 0.25-35 MG-MCG tablet Take 1 tablet by mouth daily. Reported on 11/25/2015 02/13/18   Lovena Neighbours, MD  predniSONE (STERAPRED UNI-PAK 21 TAB) 10 MG (21) TBPK tablet 6 tabs for 1 day, then 5 tabs for 1 das, then 4 tabs for 1 day, then 3 tabs for 1 day, 2 tabs for 1 day, then 1 tab for 1 day 09/12/20   Janace Aris, NP  rosuvastatin (CRESTOR) 10 MG tablet Take 1 tablet (10 mg total) by mouth daily. 09/12/20   Janace Aris, NP    Family History Family History  Problem Relation Age of Onset  . Dementia Mother     Social History Social History   Tobacco Use  . Smoking status: Never Smoker  . Smokeless tobacco: Never Used  Substance Use Topics  . Alcohol use: No  . Drug use: No     Allergies   Patient has no known allergies.   Review of Systems Review of Systems   Physical Exam Triage Vital Signs ED Triage Vitals  Enc Vitals Group     BP 09/12/20 1223 136/71     Pulse Rate 09/12/20 1223 75     Resp 09/12/20 1223 18     Temp 09/12/20 1223 98.9 F (37.2 C)     Temp Source 09/12/20 1223 Oral     SpO2 09/12/20 1223 100 %     Weight --      Height --      Head Circumference --      Peak Flow --      Pain Score 09/12/20 1221 7     Pain Loc --      Pain Edu? --      Excl. in GC? --    No data found.  Updated Vital Signs BP 136/71 (BP Location: Left Arm)   Pulse 75   Temp 98.9 F  (37.2 C) (Oral)   Resp 18   SpO2 100%   Visual Acuity Right Eye Distance:   Left Eye Distance:   Bilateral Distance:    Right Eye Near:   Left Eye Near:    Bilateral Near:     Physical Exam Vitals and nursing note reviewed.  Constitutional:      General: She is not in acute distress.    Appearance: Normal appearance. She is not ill-appearing, toxic-appearing or diaphoretic.  HENT:     Head: Normocephalic.     Ears:     Comments: Bilateral TM injection    Nose: Congestion present.     Mouth/Throat:     Comments: PND Eyes:     Conjunctiva/sclera: Conjunctivae normal.  Cardiovascular:     Rate and Rhythm: Normal rate and regular rhythm.  Pulmonary:     Effort: Pulmonary effort is normal.     Breath sounds: Normal breath sounds.  Musculoskeletal:        General: Normal range of motion.     Cervical back: Normal range of motion.  Skin:    General: Skin is warm and dry.     Findings: No rash.  Neurological:     Mental Status: She is alert.  Psychiatric:        Mood and Affect: Mood normal.      UC Treatments / Results  Labs (all labs ordered are listed, but only abnormal results are displayed) Labs Reviewed - No data to display  EKG   Radiology No results found.  Procedures Procedures (including critical care time)  Medications Ordered in UC Medications - No data to display  Initial Impression / Assessment and Plan / UC Course  I have reviewed the triage vital signs and the nursing notes.  Pertinent labs & imaging results that were available during my care of the patient were reviewed by me and considered in my medical decision making (see chart for details).     Allergies, Eustachian Tube dysfunction and rash Treating patient's  symptoms with prednisone taper over the next 6 days for eustachian tube and rash.  Zyrtec daily and Flonase daily for allergy symptoms. Refilled her Celexa and Crestor as requested Follow up as needed for continued or  worsening symptoms  Final Clinical Impressions(s) / UC Diagnoses   Final diagnoses:  Rash and nonspecific skin eruption  Eustachian tube dysfunction, bilateral  Allergy, initial encounter     Discharge Instructions     Treating for allergies, eustachian tube dysfunction and rash.  Take the medication as prescribed.  Refilled  medications as requested.  I have put to contact on your discharge instructions for primary care for follow-up    ED Prescriptions    Medication Sig Dispense Auth. Provider   citalopram (CELEXA) 20 MG tablet Take 1 tablet (20 mg total) by mouth daily. 30 tablet Dasja Brase A, NP   rosuvastatin (CRESTOR) 10 MG tablet Take 1 tablet (10 mg total) by mouth daily. 30 tablet Otisha Spickler A, NP   predniSONE (STERAPRED UNI-PAK 21 TAB) 10 MG (21) TBPK tablet 6 tabs for 1 day, then 5 tabs for 1 das, then 4 tabs for 1 day, then 3 tabs for 1 day, 2 tabs for 1 day, then 1 tab for 1 day 21 tablet Palma Buster A, NP   cetirizine (ZYRTEC) 10 MG tablet Take 1 tablet (10 mg total) by mouth daily. 30 tablet Dondi Aime A, NP   fluticasone (FLONASE) 50 MCG/ACT nasal spray Place 1 spray into both nostrils daily. 16 g Dahlia Byes A, NP     PDMP not reviewed this encounter.   Janace Aris, NP 09/15/20 [phone]

## 2021-01-19 ENCOUNTER — Ambulatory Visit: Payer: Self-pay | Admitting: Family Medicine

## 2021-01-27 ENCOUNTER — Ambulatory Visit (HOSPITAL_COMMUNITY)
Admission: EM | Admit: 2021-01-27 | Discharge: 2021-01-27 | Disposition: A | Discharge status: Home / Self Care | Payer: 59 | Attending: Medical Oncology | Admitting: Medical Oncology

## 2021-01-27 ENCOUNTER — Other Ambulatory Visit: Payer: Self-pay

## 2021-01-27 ENCOUNTER — Encounter (HOSPITAL_COMMUNITY): Payer: Self-pay

## 2021-01-27 DIAGNOSIS — R200 Anesthesia of skin: Secondary | ICD-10-CM | POA: Insufficient documentation

## 2021-01-27 DIAGNOSIS — R202 Paresthesia of skin: Secondary | ICD-10-CM | POA: Insufficient documentation

## 2021-01-27 LAB — CBC
HCT: 43.2 % (ref 36.0–46.0)
Hemoglobin: 13.4 g/dL (ref 12.0–15.0)
MCH: 25.4 pg — ABNORMAL LOW (ref 26.0–34.0)
MCHC: 31 g/dL (ref 30.0–36.0)
MCV: 82 fL (ref 80.0–100.0)
Platelets: 334 10*3/uL (ref 150–400)
RBC: 5.27 MIL/uL — ABNORMAL HIGH (ref 3.87–5.11)
RDW: 15.9 % — ABNORMAL HIGH (ref 11.5–15.5)
WBC: 6.8 10*3/uL (ref 4.0–10.5)
nRBC: 0 % (ref 0.0–0.2)

## 2021-01-27 LAB — BASIC METABOLIC PANEL
Anion gap: 6 (ref 5–15)
BUN: 8 mg/dL (ref 6–20)
CO2: 27 mmol/L (ref 22–32)
Calcium: 9.5 mg/dL (ref 8.9–10.3)
Chloride: 104 mmol/L (ref 98–111)
Creatinine, Ser: 0.85 mg/dL (ref 0.44–1.00)
GFR, Estimated: >60 mL/min (ref >60)
Glucose, Bld: 94 mg/dL (ref 70–99)
Potassium: 4.3 mmol/L (ref 3.5–5.1)
Sodium: 137 mmol/L (ref 135–145)

## 2021-01-27 NOTE — ED Provider Notes (Signed)
[facility]    CSN: [id number] Arrival date & time: 01/27/21  1515      History   Chief Complaint Chief Complaint  Patient presents with  . Numbness    HPI Emma Rhodes is a 55 y.o. female.   HPI   Numbness: Patient reports that slowly over the past 5 days she has had numbness and tingling of hands and legs.  Symptoms are worse in her fingers and toes.  She denies any other symptoms such as visual changes, headache, dizziness.  She is not able to stay hydrated or eat iron rich diet at this time due to fasting for Ramadan. She does have a history of low iron.   Past Medical History:  Diagnosis Date  . Asthma   . Asthma   . GERD (gastroesophageal reflux disease)   . HPV (human papilloma virus) infection   . HSV infection   . Low iron   . Measles   . Mumps   . Ovarian cyst   . Vaginitis   . Varicose veins     Patient Active Problem List   Diagnosis Date Noted  . RUQ pain 01/18/2017  . GERD (gastroesophageal reflux disease) 11/25/2015  . Knee pain, bilateral 05/05/2011  . IRREGULAR MENSTRUAL CYCLE 01/09/2010  . PLANTAR FASCIITIS, RIGHT 01/09/2010  . 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 Asthma, chronic 09/24/2009    Past Surgical History:  Procedure Laterality Date  . CESAREAN SECTION    . DILATION AND CURETTAGE OF UTERUS      OB History    Gravida  5   Para  4   Term      Preterm      AB      Living        SAB      IAB      Ectopic      Multiple      Live Births               Home Medications    Prior to Admission medications   Medication Sig Start Date End Date Taking? Authorizing Provider  acetaminophen (TYLENOL) 500 MG tablet Take 500 mg by mouth every 6 (six) hours as needed for moderate pain.     [provider]  ALPRAZolam Prudy Feeler) 0.25 MG tablet Take 1 tablet (0.25 mg total) by mouth 2 (two) times daily as needed for anxiety. 08/01/17   Myrlene Broker, MD  cetirizine  (ZYRTEC) 10 MG tablet Take 1 tablet (10 mg total) by mouth daily. 09/12/20   Dahlia Byes A, NP  citalopram (CELEXA) 20 MG tablet Take 1 tablet (20 mg total) by mouth daily. 09/12/20   Dahlia Byes A, NP  esomeprazole (NEXIUM) 40 MG capsule Take 1 capsule (40 mg total) by mouth daily. 12/26/17   Diallo, Lilia Argue, MD  fluticasone (FLONASE) 50 MCG/ACT nasal spray Place 1 spray into both nostrils daily. 09/12/20   Bast, Gloris Manchester A, NP  Fluticasone-Salmeterol (ADVAIR DISKUS) 250-50 MCG/DOSE AEPB INHALE 1 PUFF INTO THE LUNGS EVERY 12 HOURS 10/13/18   Mardella Layman, MD  norgestimate-ethinyl estradiol (ORTHO-CYCLEN,SPRINTEC,PREVIFEM) 0.25-35 MG-MCG tablet Take 1 tablet by mouth daily. Reported on 11/25/2015 02/13/18   Lovena Neighbours, MD  predniSONE (STERAPRED UNI-PAK 21 TAB) 10 MG (21) TBPK tablet 6 tabs for 1 day, then 5 tabs for 1 das, then 4 tabs for 1 day, then 3 tabs for 1 day, 2 tabs for 1 day, then 1 tab  for 1 day 09/12/20   Dahlia Byes A, NP  rosuvastatin (CRESTOR) 10 MG tablet Take 1 tablet (10 mg total) by mouth daily. 09/12/20   Janace Aris, NP    Family History Family History  Problem Relation Age of Onset  . Dementia Mother     Social History Social History   Tobacco Use  . Smoking status: Never Smoker  . Smokeless tobacco: Never Used  Substance Use Topics  . Alcohol use: No  . Drug use: No     Allergies   Patient has no known allergies.   Review of Systems Review of Systems  As stated above in HPI Physical Exam Triage Vital Signs ED Triage Vitals  Enc Vitals Group     BP 01/27/21 1614 129/86     Pulse Rate 01/27/21 1614 80     Resp 01/27/21 1614 16     Temp 01/27/21 1614 98.7 F (37.1 C)     Temp Source 01/27/21 1614 Oral     SpO2 01/27/21 1614 100 %     Weight 01/27/21 1618 201 lb 12.8 oz (91.5 kg)     Height --      Head Circumference --      Peak Flow --      Pain Score 01/27/21 1615 9     Pain Loc --      Pain Edu? --      Excl. in [place]? --    No data  found.  Updated Vital Signs BP 129/86 (BP Location: Left Arm)   Pulse 80   Temp 98.7 F (37.1 C) (Oral)   Resp 16   Wt 201 lb 12.8 oz (91.5 kg)   SpO2 100%   BMI 33.58 kg/m   Physical Exam Vitals and nursing note reviewed.  Constitutional:      General: She is not in acute distress.    Appearance: Normal appearance. She is not ill-appearing, toxic-appearing or diaphoretic.  HENT:     Head: Normocephalic and atraumatic.     Right Ear: Tympanic membrane and ear canal normal.     Left Ear: Tympanic membrane and ear canal normal.     Nose: Nose normal.     Mouth/Throat:     Mouth: Mucous membranes are moist.     Comments: Slight pallor of gums Eyes:     Extraocular Movements: Extraocular movements intact.     Conjunctiva/sclera: Conjunctivae normal.     Pupils: Pupils are equal, round, and reactive to light.     Comments: Slight pallor or conjunctiva   Cardiovascular:     Rate and Rhythm: Normal rate and regular rhythm.     Pulses: Normal pulses.     Heart sounds: Normal heart sounds.  Pulmonary:     Effort: Pulmonary effort is normal.     Breath sounds: Normal breath sounds.  Musculoskeletal:        General: Normal range of motion.     Cervical back: Normal range of motion and neck supple. No rigidity or tenderness.  Lymphadenopathy:     Cervical: No cervical adenopathy.  Skin:    General: Skin is warm and dry.     Capillary Refill: Capillary refill takes more than 3 seconds.     Coloration: Skin is not pale.  Neurological:     Mental Status: She is alert and oriented to person, place, and time.     Cranial Nerves: Cranial nerves are intact. No facial asymmetry.     Sensory: Sensation  is intact.     Motor: Motor function is intact.     Coordination: Coordination is intact.     Gait: Gait is intact.     Deep Tendon Reflexes: Reflexes normal.      UC Treatments / Results  Labs (all labs ordered are listed, but only abnormal results are displayed) Labs Reviewed  - No data to display  EKG   Radiology No results found.  Procedures Procedures (including critical care time)  Medications Ordered in UC Medications - No data to display  Initial Impression / Assessment and Plan / UC Course  I have reviewed the triage vital signs and the nursing notes.  Pertinent labs & imaging results that were available during my care of the patient were reviewed by me and considered in my medical decision making (see chart for details).     New.  Given history and symptoms this likely represents iron deficiency anemia paired with mild to moderate dehydration from fasting.  I have encouraged her to take an iron supplement and stay hydrated with water after her fast.  She also should take a B12 supplement which may help with symptoms.  We discussed red flag signs and symptoms that would indicate that she needs to be seen in the emergency room.  Follow-up recommended within 1 week via her primary care provider to ensure that symptoms are improving.  Final Clinical Impressions(s) / UC Diagnoses   Final diagnoses:  None   Discharge Instructions   None    ED Prescriptions    None     PDMP not reviewed this encounter.   Rushie Chestnut, [place] 01/27/21 1723

## 2021-01-27 NOTE — Discharge Instructions (Addendum)
Please start a B12 supplement and stay hydrated with water daily after your fast

## 2021-01-27 NOTE — ED Triage Notes (Signed)
Pt present numbness in both hands and feet. Symptoms started five days ago.pt states the numbness starts at her limbs and then cause pain in both legs.

## 2021-03-21 ENCOUNTER — Ambulatory Visit (HOSPITAL_COMMUNITY)
Admission: EM | Admit: 2021-03-21 | Discharge: 2021-03-21 | Disposition: A | Discharge status: Home / Self Care | Payer: 59 | Attending: Medical Oncology | Admitting: Medical Oncology

## 2021-03-21 ENCOUNTER — Ambulatory Visit (INDEPENDENT_AMBULATORY_CARE_PROVIDER_SITE_OTHER): Payer: 59

## 2021-03-21 ENCOUNTER — Other Ambulatory Visit: Payer: Self-pay

## 2021-03-21 ENCOUNTER — Encounter (HOSPITAL_COMMUNITY): Payer: Self-pay | Admitting: Emergency Medicine

## 2021-03-21 DIAGNOSIS — M545 Low back pain, unspecified: Secondary | ICD-10-CM | POA: Diagnosis not present

## 2021-03-21 DIAGNOSIS — N3001 Acute cystitis with hematuria: Secondary | ICD-10-CM | POA: Insufficient documentation

## 2021-03-21 DIAGNOSIS — R300 Dysuria: Secondary | ICD-10-CM

## 2021-03-21 LAB — POCT URINALYSIS DIPSTICK, ED / UC
Bilirubin Urine: NEGATIVE
Glucose, UA: NEGATIVE mg/dL
Ketones, ur: NEGATIVE mg/dL
Nitrite: NEGATIVE
Protein, ur: NEGATIVE mg/dL
Specific Gravity, Urine: 1.01 (ref 1.005–1.030)
Urobilinogen, UA: 0.2 mg/dL (ref 0.0–1.0)
pH: 6 (ref 5.0–8.0)

## 2021-03-21 MED ORDER — NITROFURANTOIN MONOHYD MACRO 100 MG PO CAPS: 100.0000 mg | ORAL_CAPSULE | Freq: Two times a day (BID) | ORAL | 0 refills | Status: DC

## 2021-03-21 NOTE — ED Provider Notes (Signed)
[facility]    CSN: [id number] Arrival date & time: 03/21/21  1541      History   Chief Complaint Chief Complaint  Patient presents with   Abdominal Pain   Dysuria    HPI Emma Rhodes is a 55 y.o. female.   HPI  Dysuria: Patient states that 2 weeks ago she was having dysuria and urinary urgency and was seen and treated with [facility] for suspected urinary tract infection.  She states that her dysuria did improve however the rest of her symptoms have continued.  She states that her urine looks "weird "but denies any frank hematuria.  She is having some suprapubic and some right flank pain. S/P menopausal  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    Low iron    Measles    Mumps    Ovarian cyst    Vaginitis    Varicose veins     Patient Active Problem List   Diagnosis Date Noted   RUQ pain 01/18/2017   GERD (gastroesophageal reflux disease) 11/25/2015   Knee pain, bilateral 05/05/2011   IRREGULAR MENSTRUAL CYCLE 01/09/2010   PLANTAR FASCIITIS, RIGHT 01/09/2010   ADJ DISORDER WITH MIXED ANXIETY & DEPRESSED MOOD 11/12/2009   Asthma, chronic 09/24/2009    Past Surgical History:  Procedure Laterality Date   CESAREAN SECTION     DILATION AND CURETTAGE OF UTERUS      OB History     Gravida  5   Para  4   Term      Preterm      AB      Living         SAB      IAB      Ectopic      Multiple      Live Births               Home Medications    Prior to Admission medications   Medication Sig Start Date End Date Taking? Authorizing Provider  acetaminophen (TYLENOL) 500 MG tablet Take 500 mg by mouth every 6 (six) hours as needed for moderate pain.     [provider]  ALPRAZolam Prudy Feeler) 0.25 MG tablet Take 1 tablet (0.25 mg total) by mouth 2 (two) times daily as needed for anxiety. 08/01/17   Myrlene Broker, MD  cetirizine (ZYRTEC) 10 MG tablet Take 1 tablet (10 mg total) by mouth daily. 09/12/20   Dahlia Byes A, NP  citalopram (CELEXA) 20 MG tablet Take 1 tablet (20 mg total) by mouth daily. 09/12/20   Dahlia Byes A, NP  esomeprazole (NEXIUM) 40 MG capsule Take 1 capsule (40 mg total) by mouth daily. 12/26/17   Diallo, Lilia Argue, MD  fluticasone (FLONASE) 50 MCG/ACT nasal spray Place 1 spray into both nostrils daily. 09/12/20   Bast, Gloris Manchester A, NP  Fluticasone-Salmeterol (ADVAIR DISKUS) 250-50 MCG/DOSE AEPB INHALE 1 PUFF INTO THE LUNGS EVERY 12 HOURS 10/13/18   Mardella Layman, MD  norgestimate-ethinyl estradiol (ORTHO-CYCLEN,SPRINTEC,PREVIFEM) 0.25-35 MG-MCG tablet Take 1 tablet by mouth daily. Reported on 11/25/2015 02/13/18   Lovena Neighbours, MD  predniSONE (STERAPRED UNI-PAK 21 TAB) 10 MG (21) TBPK tablet 6 tabs for 1 day, then 5 tabs for 1 das, then 4 tabs for 1 day, then 3 tabs for 1 day, 2 tabs for 1 day, then  1 tab for 1 day 09/12/20   Dahlia Byes A, NP  rosuvastatin (CRESTOR) 10 MG tablet Take 1 tablet (10 mg total) by mouth daily. 09/12/20   Janace Aris, NP    Family History Family History  Problem Relation Age of Onset   Dementia Mother     Social History Social History   Tobacco Use   Smoking status: Never   Smokeless tobacco: Never  Substance Use Topics   Alcohol use: No   Drug use: No     Allergies   Patient has no known allergies.   Review of Systems Review of Systems  As stated above in HPI Physical Exam Triage Vital Signs ED Triage Vitals  Enc Vitals Group     BP 03/21/21 1602 127/85     Pulse Rate 03/21/21 1602 73     Resp 03/21/21 1602 16     Temp 03/21/21 1602 97.9 F (36.6 C)     Temp Source 03/21/21 1602 Oral     SpO2 03/21/21 1602 98 %     Weight --      Height --      Head Circumference --      Peak Flow --      Pain Score 03/21/21 1600 6     Pain Loc --      Pain Edu? --      Excl. in GC? --    No data found.  Updated Vital Signs BP  127/85 (BP Location: Right Arm)   Pulse 73   Temp 97.9 F (36.6 C) (Oral)   Resp 16   SpO2 98%   Physical Exam Vitals and nursing note reviewed.  Constitutional:      General: She is not in acute distress.    Appearance: She is well-developed. She is not ill-appearing, toxic-appearing or diaphoretic.  Cardiovascular:     Rate and Rhythm: Normal rate and regular rhythm.  Pulmonary:     Effort: Pulmonary effort is normal.     Breath sounds: Normal breath sounds.  Abdominal:     General: Abdomen is flat. Bowel sounds are normal.     Palpations: Abdomen is soft. There is no hepatomegaly or splenomegaly.     Tenderness: There is no abdominal tenderness. There is no right CVA tenderness, left CVA tenderness, guarding or rebound.     Hernia: No hernia is present.  Skin:    General: Skin is warm.  Neurological:     Mental Status: She is alert and oriented to person, place, and time.     UC Treatments / Results  Labs (all labs ordered are listed, but only abnormal results are displayed) Labs Reviewed  POCT URINALYSIS DIPSTICK, ED / UC - Abnormal; Notable for the following components:      Result Value   Hgb urine dipstick MODERATE (*)    Leukocytes,Ua LARGE (*)    All other components within normal limits    EKG   Radiology No results found.  Procedures Procedures (including critical care time)  Medications Ordered in UC Medications - No data to display  Initial Impression / Assessment and Plan / UC Course  I have reviewed the triage vital signs and the nursing notes.  Pertinent labs & imaging results that were available during my care of the patient were reviewed by me and considered in my medical decision making (see chart for details).    New. KUB pending to ensure no sign of nephrolithiasis.  If this is not visualized  we are going to culture her urine and start her on a different antibiotic.  Hydration with water encouraged. Final Clinical Impressions(s) / UC  Diagnoses   Final diagnoses:  None   Discharge Instructions   None    ED Prescriptions   None    PDMP not reviewed this encounter.   Rushie Chestnut, [place] 03/21/21 1642

## 2021-03-21 NOTE — ED Triage Notes (Signed)
Pt presents with abdominal pressure, right side back pain and dysuria xs 2 week. States was seen and treated at another urgent care, states symptoms did not subside with Omnicef.

## 2021-03-22 LAB — URINE CULTURE: Culture: NO GROWTH

## 2021-06-25 IMAGING — DX DG ABDOMEN 1V
2 series · 2 of 2 positions shown · non-contrast
Comparison: None.

CLINICAL DATA: Right-sided back pain and dysuria for 2 weeks.

EXAM:
ABDOMEN - 1 VIEW

[abdomen kub (1 of 2)]
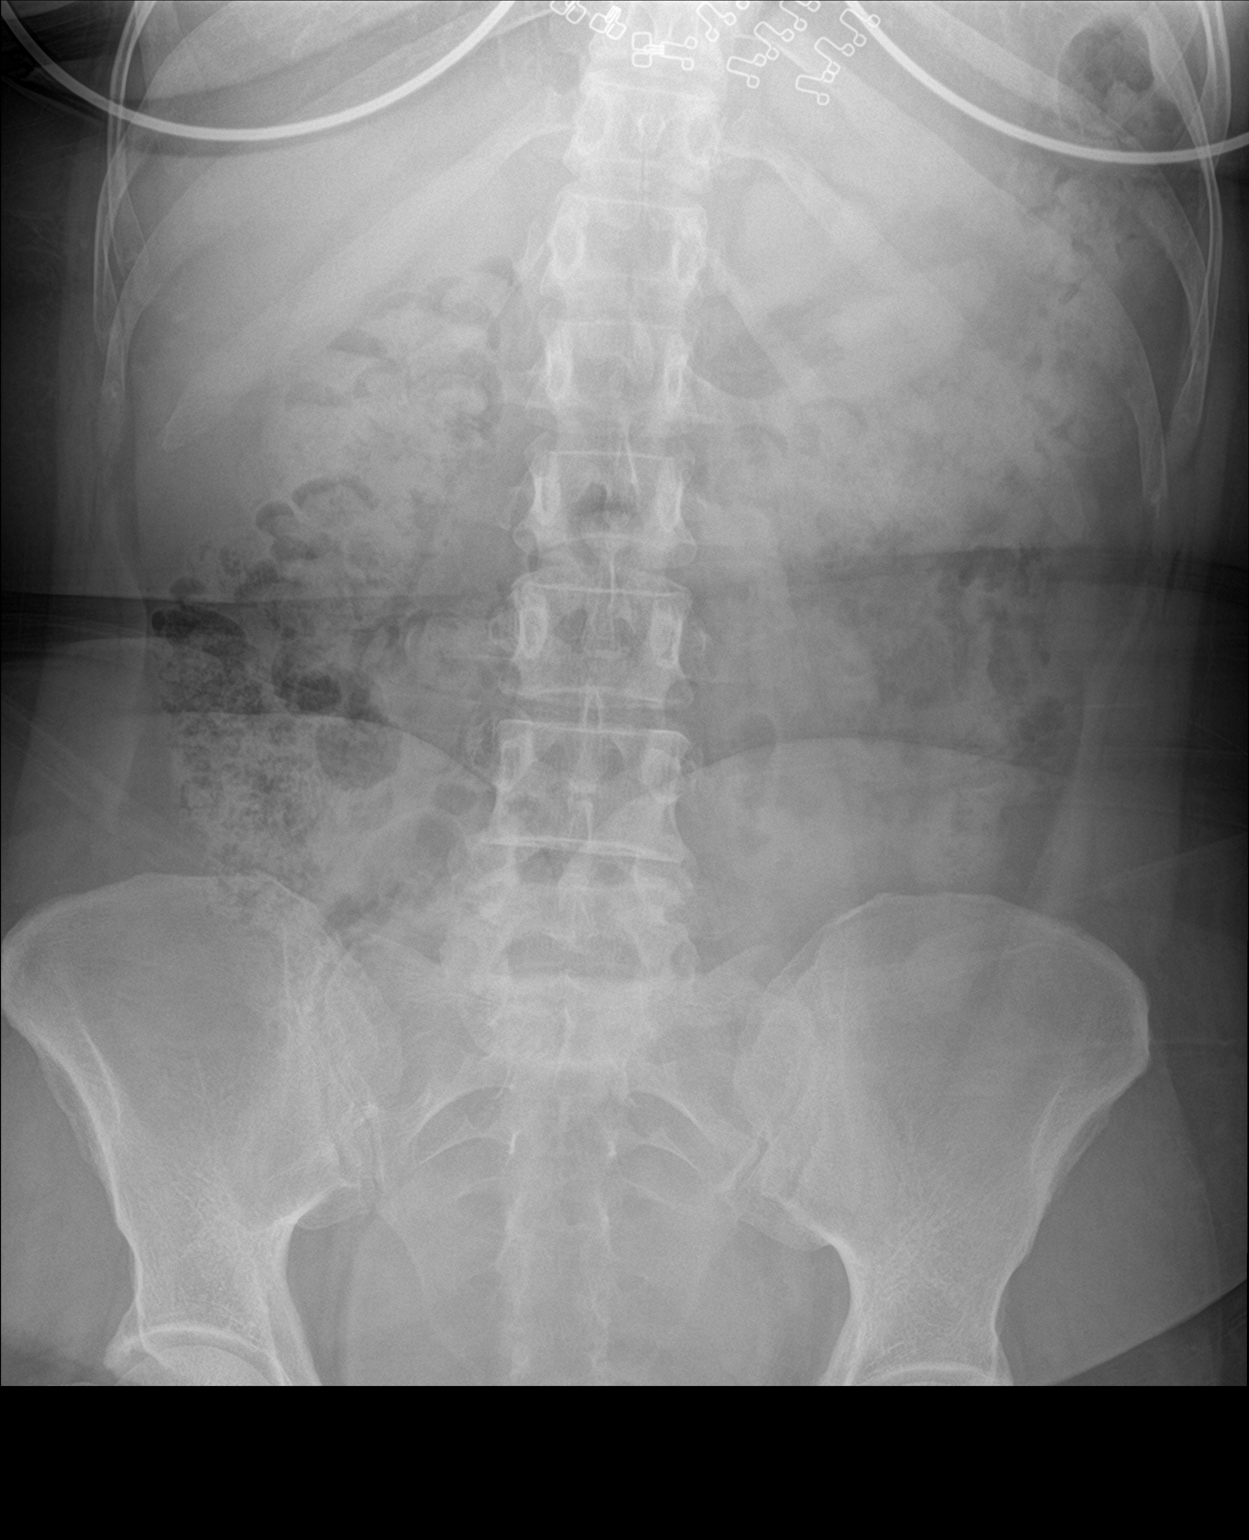

[abdomen kub (2 of 2)]
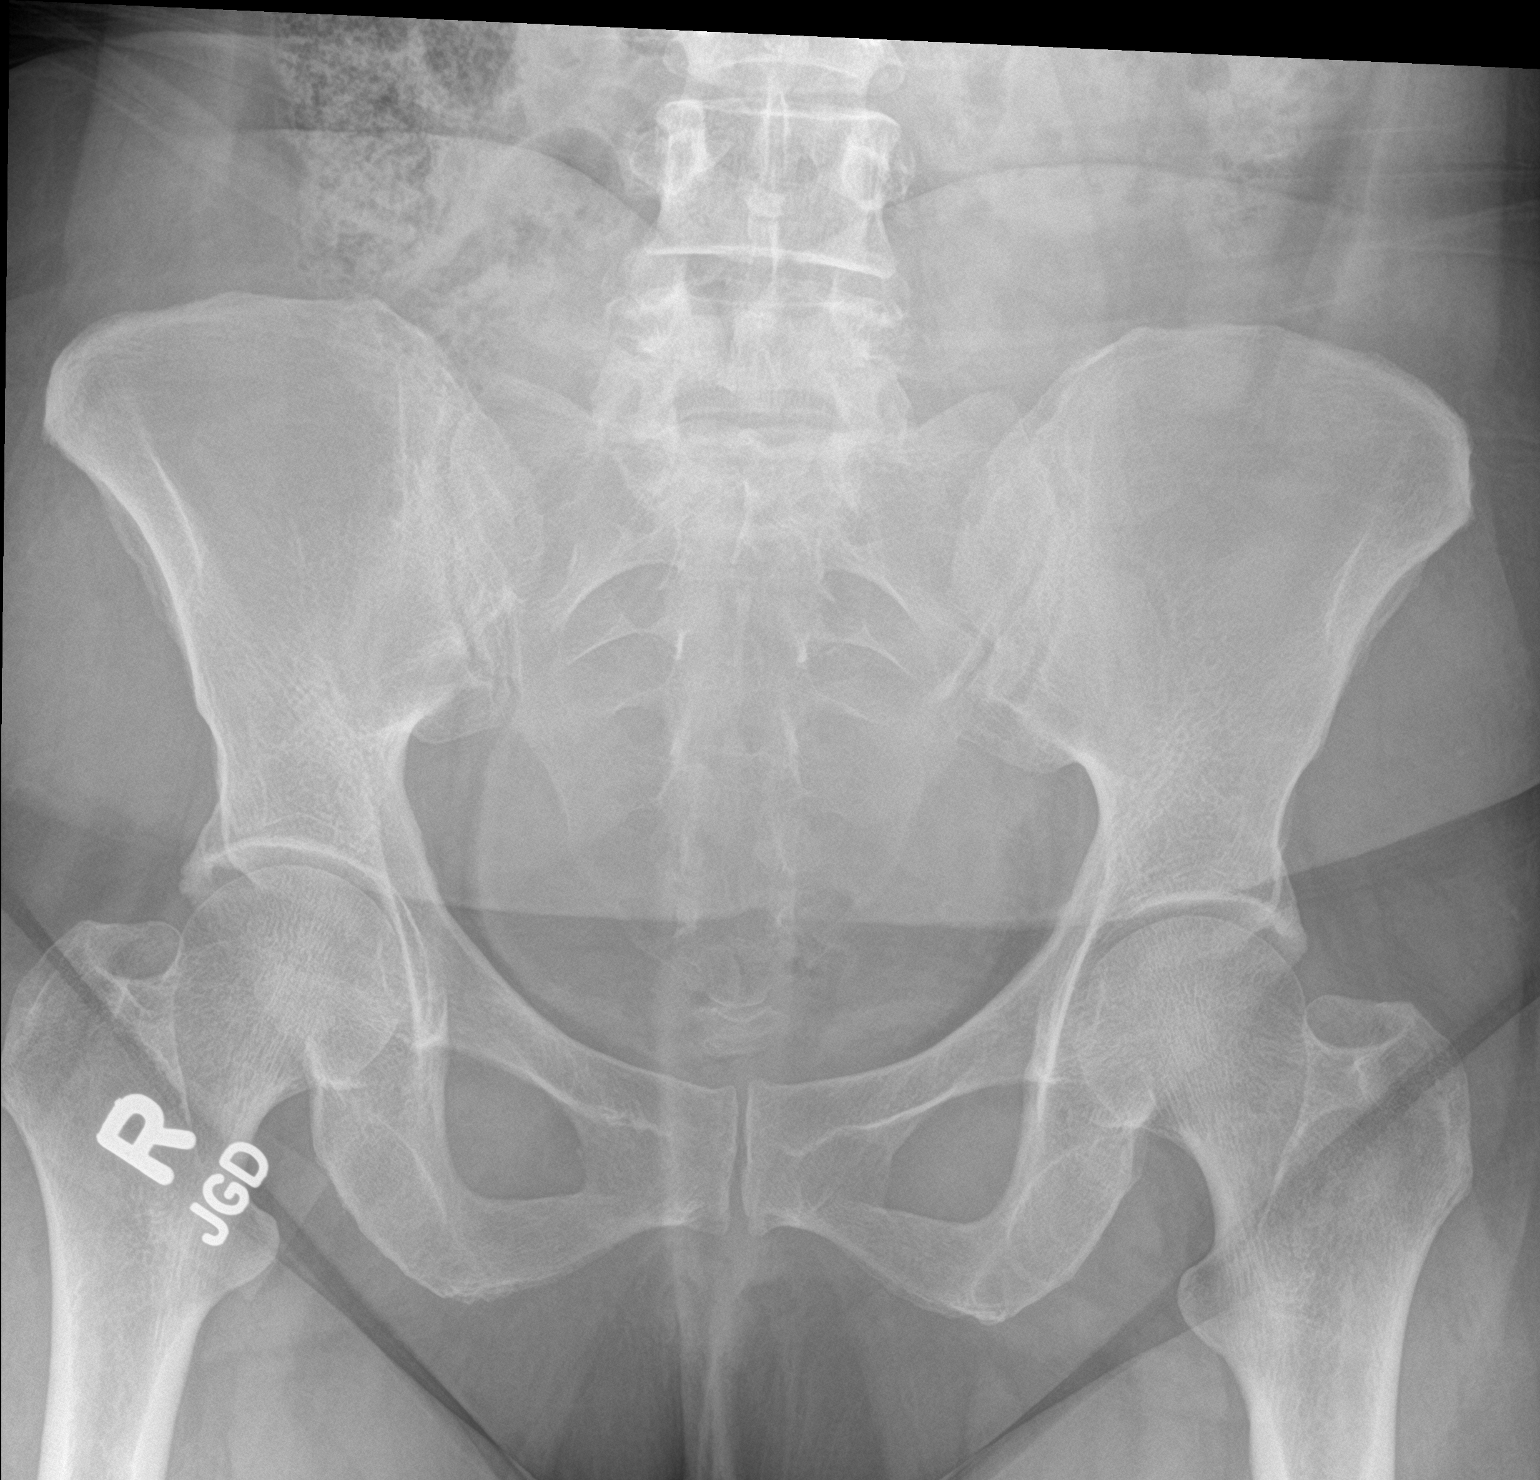

[2 of 2 positions shown; findings below may reference images not displayed]

FINDINGS: The bowel gas pattern is unremarkable. No findings for obstruction
perforation. The soft tissue shadows are maintained. No worrisome
calcifications are identified. Specifically, I do not see any
definite renal, ureteral or bladder calculi.

The bony structures are unremarkable.
IMPRESSION: No plain film findings for an acute abdominal process.

## 2022-02-16 ENCOUNTER — Ambulatory Visit: Payer: Self-pay | Admitting: Internal Medicine

## 2022-02-26 ENCOUNTER — Ambulatory Visit: Payer: Self-pay | Admitting: Internal Medicine

## 2022-07-02 ENCOUNTER — Ambulatory Visit (HOSPITAL_COMMUNITY)
Admission: EM | Admit: 2022-07-02 | Discharge: 2022-07-02 | Disposition: A | Discharge status: Home / Self Care | Payer: Commercial Managed Care - HMO | Attending: Physician Assistant | Admitting: Physician Assistant

## 2022-07-02 DIAGNOSIS — J4520 Mild intermittent asthma, uncomplicated: Secondary | ICD-10-CM | POA: Diagnosis not present

## 2022-07-02 DIAGNOSIS — J069 Acute upper respiratory infection, unspecified: Secondary | ICD-10-CM

## 2022-07-02 MED ORDER — FLUTICASONE-SALMETEROL 250-50 MCG/DOSE IN AEPB: INHALATION_SPRAY | RESPIRATORY_TRACT | 2 refills | Status: DC

## 2022-07-02 NOTE — ED Provider Notes (Signed)
[facility]    CSN: [id number] Arrival date & time: 07/02/22  1801      History   Chief Complaint Chief Complaint  Patient presents with   Asthma    HPI Emma Rhodes is a 56 y.o. female.   Pt with h/o asthma presents with mild congestion, sneezing, scratchy throat that started several days ago.  She reports she typically takes Advair daily, but has been out of this inhaler for about two days.  She is working on finding a new PCP due to insurance changes.  She reports she has had some wheezing.  Denies fever, chills, body aches.  Reports son at home with similar sx.  She has been taking Zyrtec.     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    Low iron    Measles    Mumps    Ovarian cyst    Vaginitis    Varicose veins     Patient Active Problem List   Diagnosis Date Noted   RUQ pain 01/18/2017   GERD (gastroesophageal reflux disease) 11/25/2015   Knee pain, bilateral 05/05/2011   IRREGULAR MENSTRUAL CYCLE 01/09/2010   PLANTAR FASCIITIS, RIGHT 01/09/2010   ADJ DISORDER WITH MIXED ANXIETY & DEPRESSED MOOD 11/12/2009   Asthma, chronic 09/24/2009    Past Surgical History:  Procedure Laterality Date   CESAREAN SECTION     DILATION AND CURETTAGE OF UTERUS      OB History     Gravida  5   Para  4   Term      Preterm      AB      Living         SAB      IAB      Ectopic      Multiple      Live Births               Home Medications    Prior to Admission medications   Medication Sig Start Date End Date Taking? Authorizing Provider  acetaminophen (TYLENOL) 500 MG tablet Take 500 mg by mouth every 6 (six) hours as needed for moderate pain.     [provider]  ALPRAZolam Prudy Feeler) 0.25 MG tablet Take 1 tablet (0.25 mg total) by mouth 2 (two) times daily as needed for anxiety. 08/01/17   Myrlene Broker, MD  cetirizine (ZYRTEC) 10 MG tablet Take 1 tablet (10 mg total) by mouth daily. 09/12/20   Dahlia Byes A, NP  citalopram (CELEXA) 20 MG tablet Take 1 tablet (20 mg total) by mouth daily. 09/12/20   Dahlia Byes A, NP  esomeprazole (NEXIUM) 40 MG capsule Take 1 capsule (40 mg total) by mouth daily. 12/26/17   Diallo, Lilia Argue, MD  fluticasone (FLONASE) 50 MCG/ACT nasal spray Place 1 spray into both nostrils daily. 09/12/20   Dahlia Byes A, NP  Fluticasone-Salmeterol (ADVAIR) 250-50 MCG/DOSE AEPB INHALE 1 PUFF INTO THE LUNGS EVERY 12 HOURS 07/02/22   Ward, Tylene Fantasia, PA-C  nitrofurantoin, macrocrystal-monohydrate, (MACROBID) 100 MG capsule Take 1 capsule (100 mg total) by mouth 2 (two) times daily. 03/21/21   Rushie Chestnut, PA-C  norgestimate-ethinyl estradiol (ORTHO-CYCLEN,SPRINTEC,PREVIFEM) 0.25-35 MG-MCG tablet Take 1 tablet by mouth daily. Reported on 11/25/2015 02/13/18   Lovena Neighbours, MD  predniSONE (STERAPRED UNI-PAK 21 TAB) 10 MG (21)  TBPK tablet 6 tabs for 1 day, then 5 tabs for 1 das, then 4 tabs for 1 day, then 3 tabs for 1 day, 2 tabs for 1 day, then 1 tab for 1 day 09/12/20   Loura Halt A, NP  rosuvastatin (CRESTOR) 10 MG tablet Take 1 tablet (10 mg total) by mouth daily. 09/12/20   Orvan July, NP    Family History Family History  Problem Relation Age of Onset   Dementia Mother     Social History Social History   Tobacco Use   Smoking status: Never   Smokeless tobacco: Never  Substance Use Topics   Alcohol use: No   Drug use: No     Allergies   Patient has no known allergies.   Review of Systems Review of Systems  Constitutional:  Negative for chills and fever.  HENT:  Positive for congestion and postnasal drip. Negative for ear pain and sore throat.   Eyes:  Negative for pain and visual disturbance.  Respiratory:  Negative for cough and shortness of breath.   Cardiovascular:  Negative for chest pain and palpitations.  Gastrointestinal:  Negative for  abdominal pain and vomiting.  Genitourinary:  Negative for dysuria and hematuria.  Musculoskeletal:  Negative for arthralgias and back pain.  Skin:  Negative for color change and rash.  Neurological:  Negative for seizures and syncope.  All other systems reviewed and are negative.    Physical Exam Triage Vital Signs ED Triage Vitals  Enc Vitals Group     BP 07/02/22 1812 113/76     Pulse Rate 07/02/22 1812 81     Resp 07/02/22 1812 12     Temp 07/02/22 1812 98.2 F (36.8 C)     Temp Source 07/02/22 1812 Oral     SpO2 07/02/22 1812 96 %     Weight 07/02/22 1818 180 lb (81.6 kg)     Height 07/02/22 1818 5\' 7"  (1.702 m)     Head Circumference --      Peak Flow --      Pain Score 07/02/22 1815 0     Pain Loc --      Pain Edu? --      Excl. in [place]? --    No data found.  Updated Vital Signs BP 113/76 (BP Location: Left Arm)   Pulse 81   Temp 98.2 F (36.8 C) (Oral)   Resp 12   Ht 5\' 7"  (1.702 m)   Wt 180 lb (81.6 kg)   SpO2 96%   BMI 28.19 kg/m   Visual Acuity Right Eye Distance:   Left Eye Distance:   Bilateral Distance:    Right Eye Near:   Left Eye Near:    Bilateral Near:     Physical Exam Vitals and nursing note reviewed.  Constitutional:      General: She is not in acute distress.    Appearance: She is well-developed.  HENT:     Head: Normocephalic and atraumatic.  Eyes:     Conjunctiva/sclera: Conjunctivae normal.  Cardiovascular:     Rate and Rhythm: Normal rate and regular rhythm.     Heart sounds: No murmur heard. Pulmonary:     Effort: Pulmonary effort is normal. No respiratory distress.     Breath sounds: Normal breath sounds.  Abdominal:     Palpations: Abdomen is soft.     Tenderness: There is no abdominal tenderness.  Musculoskeletal:        General: No swelling.  Cervical back: Neck supple.  Skin:    General: Skin is warm and dry.     Capillary Refill: Capillary refill takes less than 2 seconds.  Neurological:     Mental Status:  She is alert.  Psychiatric:        Mood and Affect: Mood normal.      UC Treatments / Results  Labs (all labs ordered are listed, but only abnormal results are displayed) Labs Reviewed - No data to display  EKG   Radiology No results found.  Procedures Procedures (including critical care time)  Medications Ordered in UC Medications - No data to display  Initial Impression / Assessment and Plan / UC Course  I have reviewed the triage vital signs and the nursing notes.  Pertinent labs & imaging results that were available during my care of the patient were reviewed by me and considered in my medical decision making (see chart for details).     URI, mild sx at this time.  Overall well appearing, in no acute distress.  Supportive care discussed. Return precautions discussed.   Refills of Advair given today.  Advised f/u with PCP.  PCP assistance ordered.   Final Clinical Impressions(s) / UC Diagnoses   Final diagnoses:  Mild intermittent asthma, unspecified whether complicated  Acute upper respiratory infection     Discharge Instructions      Resume Advair Recommend Mucinex and Flonase for upper respiratory symptoms Rest, drink plenty of fluids Return if symptoms become worse.      ED Prescriptions     Medication Sig Dispense Auth. Provider   Fluticasone-Salmeterol (ADVAIR) 250-50 MCG/DOSE AEPB INHALE 1 PUFF INTO THE LUNGS EVERY 12 HOURS 180 each Ward, Tylene Fantasia, PA-C      PDMP not reviewed this encounter.   Ward, Tylene Fantasia, PA-C 07/02/22 1850

## 2022-07-02 NOTE — ED Triage Notes (Signed)
Asthma flare , sneezing. Pt states eyes and throat has been itchy, sneezing . Pt states she is out of her inhaler x2days

## 2022-07-02 NOTE — Discharge Instructions (Signed)
Resume Advair Recommend Mucinex and Flonase for upper respiratory symptoms Rest, drink plenty of fluids Return if symptoms become worse.

## 2022-08-19 ENCOUNTER — Ambulatory Visit (INDEPENDENT_AMBULATORY_CARE_PROVIDER_SITE_OTHER): Payer: Commercial Managed Care - HMO | Admitting: Internal Medicine

## 2022-08-19 ENCOUNTER — Encounter: Payer: Self-pay | Admitting: Internal Medicine

## 2022-08-19 VITALS — BP 124/80 | HR 85 | Temp 98.2°F | Ht 66.0 in | Wt 199.0 lb

## 2022-08-19 DIAGNOSIS — M2540 Effusion, unspecified joint: Secondary | ICD-10-CM | POA: Diagnosis not present

## 2022-08-19 DIAGNOSIS — J4520 Mild intermittent asthma, uncomplicated: Secondary | ICD-10-CM | POA: Diagnosis not present

## 2022-08-19 DIAGNOSIS — Z0000 Encounter for general adult medical examination without abnormal findings: Secondary | ICD-10-CM | POA: Diagnosis not present

## 2022-08-19 DIAGNOSIS — K219 Gastro-esophageal reflux disease without esophagitis: Secondary | ICD-10-CM | POA: Diagnosis not present

## 2022-08-19 DIAGNOSIS — F4323 Adjustment disorder with mixed anxiety and depressed mood: Secondary | ICD-10-CM

## 2022-08-19 LAB — LIPID PANEL
Cholesterol: 253 mg/dL — ABNORMAL HIGH (ref 0–200)
HDL: 53.2 mg/dL (ref >39.00)
LDL Cholesterol: 167 mg/dL — ABNORMAL HIGH (ref 0–99)
NonHDL: 199.85
Total CHOL/HDL Ratio: 5
Triglycerides: 165 mg/dL — ABNORMAL HIGH (ref 0.0–149.0)
VLDL: 33 mg/dL (ref 0.0–40.0)

## 2022-08-19 LAB — CBC
HCT: 38.1 % (ref 36.0–46.0)
Hemoglobin: 12.1 g/dL (ref 12.0–15.0)
MCHC: 31.7 g/dL (ref 30.0–36.0)
MCV: 82 fl (ref 78.0–100.0)
Platelets: 343 10*3/uL (ref 150.0–400.0)
RBC: 4.64 Mil/uL (ref 3.87–5.11)
RDW: 15.5 % (ref 11.5–15.5)
WBC: 7.2 10*3/uL (ref 4.0–10.5)

## 2022-08-19 LAB — COMPREHENSIVE METABOLIC PANEL
ALT: 21 U/L (ref 0–35)
AST: 21 U/L (ref 0–37)
Albumin: 3.8 g/dL (ref 3.5–5.2)
Alkaline Phosphatase: 100 U/L (ref 39–117)
BUN: 15 mg/dL (ref 6–23)
CO2: 28 mEq/L (ref 19–32)
Calcium: 9 mg/dL (ref 8.4–10.5)
Chloride: 104 mEq/L (ref 96–112)
Creatinine, Ser: 1 mg/dL (ref 0.40–1.20)
GFR: 63.17 mL/min (ref >60.00)
Glucose, Bld: 82 mg/dL (ref 70–99)
Potassium: 4 mEq/L (ref 3.5–5.1)
Sodium: 136 mEq/L (ref 135–145)
Total Bilirubin: 0.2 mg/dL (ref 0.2–1.2)
Total Protein: 7.4 g/dL (ref 6.0–8.3)

## 2022-08-19 MED ORDER — ALBUTEROL SULFATE HFA 108 (90 BASE) MCG/ACT IN AERS: 2.0000 | INHALATION_SPRAY | Freq: Four times a day (QID) | RESPIRATORY_TRACT | 2 refills | Status: DC | PRN

## 2022-08-19 MED ORDER — OMEPRAZOLE 40 MG PO CPDR: 40.0000 mg | DELAYED_RELEASE_CAPSULE | Freq: Every day | ORAL | 3 refills | Status: DC

## 2022-08-19 MED ORDER — MONTELUKAST SODIUM 10 MG PO TABS: 10.0000 mg | ORAL_TABLET | Freq: Every day | ORAL | 3 refills | Status: DC

## 2022-08-19 MED ORDER — MELOXICAM 15 MG PO TABS: 15.0000 mg | ORAL_TABLET | Freq: Every day | ORAL | 0 refills | Status: DC

## 2022-08-19 MED ORDER — CITALOPRAM HYDROBROMIDE 20 MG PO TABS: 20.0000 mg | ORAL_TABLET | Freq: Every day | ORAL | 3 refills | Status: DC

## 2022-08-19 NOTE — Patient Instructions (Addendum)
We have sent in meloxicam to use daily for the pain in the joints.   We have sent in singulair (montelukast) to take daily in the evening to help with the allergies and asthma.  We have refilled the albuterol inhaler.   We have sent in omeprazole so that the insurance will pay for this.  We are checking the labs and will let you know about the results.

## 2022-08-19 NOTE — Progress Notes (Signed)
   Subjective:   Patient ID: Emma Rhodes, female    DOB: July 12, 1966, 56 y.o.   MRN: [id number]  HPI The patient is a new 56 YO female last seen 2018 has mostly seen gyn since that time. Has several concerns.  PMH, [facility], social history reviewed and updated  Review of Systems  Constitutional:  Positive for activity change.  HENT: Negative.    Eyes: Negative.   Respiratory:  Positive for wheezing. Negative for cough, chest tightness and shortness of breath.        Rare not present today  Cardiovascular:  Negative for chest pain, palpitations and leg swelling.  Gastrointestinal:  Negative for abdominal distention, abdominal pain, constipation, diarrhea, nausea and vomiting.       GERD  Musculoskeletal:  Positive for arthralgias, joint swelling and myalgias.  Skin: Negative.   Neurological: Negative.   Psychiatric/Behavioral:  Positive for sleep disturbance.     Objective:  Physical Exam Constitutional:      Appearance: She is well-developed.  HENT:     Head: Normocephalic and atraumatic.  Cardiovascular:     Rate and Rhythm: Normal rate and regular rhythm.  Pulmonary:     Effort: Pulmonary effort is normal. No respiratory distress.     Breath sounds: Normal breath sounds. No wheezing or rales.  Abdominal:     General: Bowel sounds are normal. There is no distension.     Palpations: Abdomen is soft.     Tenderness: There is no abdominal tenderness. There is no rebound.  Musculoskeletal:        General: Tenderness and deformity present.     Cervical back: Normal range of motion.     Comments: Bilateral 1st fingers deviate ulnar  Skin:    General: Skin is warm and dry.  Neurological:     Mental Status: She is alert and oriented to person, place, and time.     Coordination: Coordination normal.     Vitals:   08/19/22 1117  BP: 124/80  Pulse: 85  Temp: 98.2 F (36.8 C)  TempSrc: Oral  SpO2: 97%  Weight: 199 lb (90.3 kg)  Height: 5\' 6"  (1.676 m)    Assessment &  Plan:

## 2022-08-20 DIAGNOSIS — M2540 Effusion, unspecified joint: Secondary | ICD-10-CM | POA: Insufficient documentation

## 2022-08-20 NOTE — Assessment & Plan Note (Signed)
Hand pain and swelling with ulnar deviation of the 1st fingers suspicious for inflammatory arthritis. Checking ANA and comprehensive panel as well as CCP and RF. Rx meloxicam 15 mg daily to try for pain relief in the meantime.

## 2022-08-20 NOTE — Assessment & Plan Note (Signed)
No flare today but without rescue inhaler. Rx albuterol inhaler and struggling with allergies and has tried and failed otc options. Rx montelukast 10 mg qhs which may help with sleep as well.

## 2022-08-20 NOTE — Assessment & Plan Note (Signed)
Not taking anything lately and having symptoms. Rx omeprazole 40 mg daily ongoing.

## 2022-08-20 NOTE — Assessment & Plan Note (Signed)
States she is still taking celexa 20 mg daily. She is having some ongoing stress in her life and needs this ongoing. Rx done at visit.

## 2022-08-21 LAB — ANA, IFA COMPREHENSIVE PANEL
Anti Nuclear Antibody (ANA): NEGATIVE
ENA SM Ab Ser-aCnc: <1 AI
SM/RNP: <1 AI
SSA (Ro) (ENA) Antibody, IgG: <1 AI
SSB (La) (ENA) Antibody, IgG: <1 AI
Scleroderma (Scl-70) (ENA) Antibody, IgG: <1 AI
ds DNA Ab: <1 IU/mL

## 2022-08-21 LAB — RHEUMATOID FACTOR: Rheumatoid fact SerPl-aCnc: <14 IU/mL (ref <14)

## 2022-08-21 LAB — CYCLIC CITRUL PEPTIDE ANTIBODY, IGG: Cyclic Citrullin Peptide Ab: <16 UNITS

## 2022-09-13 ENCOUNTER — Encounter: Payer: Self-pay | Admitting: Internal Medicine

## 2022-09-13 ENCOUNTER — Ambulatory Visit (INDEPENDENT_AMBULATORY_CARE_PROVIDER_SITE_OTHER): Payer: Commercial Managed Care - HMO | Admitting: Internal Medicine

## 2022-09-13 ENCOUNTER — Ambulatory Visit (INDEPENDENT_AMBULATORY_CARE_PROVIDER_SITE_OTHER): Payer: Commercial Managed Care - HMO

## 2022-09-13 VITALS — BP 128/80 | HR 79 | Temp 98.4°F | Ht 66.0 in | Wt 199.0 lb

## 2022-09-13 DIAGNOSIS — Z0001 Encounter for general adult medical examination with abnormal findings: Secondary | ICD-10-CM | POA: Diagnosis not present

## 2022-09-13 DIAGNOSIS — M79641 Pain in right hand: Secondary | ICD-10-CM | POA: Diagnosis not present

## 2022-09-13 DIAGNOSIS — Z1211 Encounter for screening for malignant neoplasm of colon: Secondary | ICD-10-CM

## 2022-09-13 DIAGNOSIS — K219 Gastro-esophageal reflux disease without esophagitis: Secondary | ICD-10-CM | POA: Diagnosis not present

## 2022-09-13 DIAGNOSIS — J4520 Mild intermittent asthma, uncomplicated: Secondary | ICD-10-CM | POA: Diagnosis not present

## 2022-09-13 DIAGNOSIS — M79642 Pain in left hand: Secondary | ICD-10-CM

## 2022-09-13 DIAGNOSIS — R21 Rash and other nonspecific skin eruption: Secondary | ICD-10-CM

## 2022-09-13 MED ORDER — FLUTICASONE-SALMETEROL 100-50 MCG/ACT IN AEPB: 1.0000 | INHALATION_SPRAY | Freq: Two times a day (BID) | RESPIRATORY_TRACT | 3 refills | Status: DC

## 2022-09-13 MED ORDER — CYCLOBENZAPRINE HCL 5 MG PO TABS: 5.0000 mg | ORAL_TABLET | Freq: Two times a day (BID) | ORAL | 1 refills | Status: DC | PRN

## 2022-09-13 NOTE — Progress Notes (Signed)
   Subjective:   Patient ID: Emma Rhodes, female    DOB: 1966/02/18, 56 y.o.   MRN: [id number]  HPI The patient is here for physical. Also having ongoing joint pain and meloxicam not working and concerns about rash on face.  PMH, [facility], social history reviewed and updated  Review of Systems  Constitutional: Negative.   HENT: Negative.    Eyes: Negative.   Respiratory:  Negative for cough, chest tightness and shortness of breath.   Cardiovascular:  Negative for chest pain, palpitations and leg swelling.  Gastrointestinal:  Negative for abdominal distention, abdominal pain, constipation, diarrhea, nausea and vomiting.  Musculoskeletal:  Positive for arthralgias, joint swelling and myalgias.  Skin:  Positive for color change.  Neurological: Negative.   Psychiatric/Behavioral: Negative.      Objective:  Physical Exam Constitutional:      Appearance: She is well-developed.  HENT:     Head: Normocephalic and atraumatic.  Cardiovascular:     Rate and Rhythm: Normal rate and regular rhythm.  Pulmonary:     Effort: Pulmonary effort is normal. No respiratory distress.     Breath sounds: Normal breath sounds. No wheezing or rales.  Abdominal:     General: Bowel sounds are normal. There is no distension.     Palpations: Abdomen is soft.     Tenderness: There is no abdominal tenderness. There is no rebound.  Musculoskeletal:        General: Tenderness present.     Cervical back: Normal range of motion.  Skin:    General: Skin is warm and dry.  Neurological:     Mental Status: She is alert and oriented to person, place, and time.     Coordination: Coordination normal.     Vitals:   09/13/22 1431  BP: 128/80  Pulse: 79  Temp: 98.4 F (36.9 C)  TempSrc: Oral  SpO2: 97%  Weight: 199 lb (90.3 kg)  Height: 5\' 6"  (1.676 m)    Assessment & Plan:

## 2022-09-13 NOTE — Patient Instructions (Addendum)
We have sent in the advair.   We will check the x-ray of the hands today.  Call us and let us know what the name of the medicine is for the face or send [place] the information on mychart.   I have also sent in clyclobenzaprine which is as needed you can take for the pain which is stronger.

## 2022-09-15 ENCOUNTER — Encounter: Payer: Self-pay | Admitting: Internal Medicine

## 2022-09-15 DIAGNOSIS — M79641 Pain in right hand: Secondary | ICD-10-CM | POA: Insufficient documentation

## 2022-09-15 DIAGNOSIS — Z0001 Encounter for general adult medical examination with abnormal findings: Secondary | ICD-10-CM | POA: Insufficient documentation

## 2022-09-15 DIAGNOSIS — R21 Rash and other nonspecific skin eruption: Secondary | ICD-10-CM | POA: Insufficient documentation

## 2022-09-15 NOTE — Assessment & Plan Note (Signed)
She had forgotten to mention previous visit that she is taking advair daily (not BID) and needs refill. Refill done on this today and she has refills on albuterol to use prn.

## 2022-09-15 NOTE — Assessment & Plan Note (Signed)
Considering that auto-immune workup was negative will check x-ray both hands as she does have ulnar deviation and significant pain and morning stiffness. Concern for seronegative RA. Keep meloxicam and add flexeril 5 mg BID prn for pain. She is okay to take tylenol otc as well.

## 2022-09-15 NOTE — Assessment & Plan Note (Signed)
Flu shot declines. Covid-19 counseled. Shingrix declines. Tetanus declines. Colonoscopy referral to GI. Mammogram up to date with gyn, pap smear up to date with gyn. Counseled about sun safety and mole surveillance. Counseled about the dangers of distracted driving. Given 10 year screening recommendations.

## 2022-09-15 NOTE — Assessment & Plan Note (Signed)
She is improved with omeprazole 40 mg daily and will continue.

## 2022-09-15 NOTE — Assessment & Plan Note (Signed)
She was being treated for something she is unsure by ob/gyn with an oral medicine. I asked her if this was rosacea and she said no. There is no discrete rash or acne on face today and she is about to run out of medicine. Asked her to call us with the name of this medicine so we can try to figure out if ongoing treatment with same is appropriate or not.

## 2022-10-18 ENCOUNTER — Other Ambulatory Visit: Payer: Self-pay | Admitting: Internal Medicine

## 2022-10-22 ENCOUNTER — Encounter (HOSPITAL_COMMUNITY): Payer: Self-pay | Admitting: Emergency Medicine

## 2022-10-22 ENCOUNTER — Ambulatory Visit (HOSPITAL_COMMUNITY)
Admission: EM | Admit: 2022-10-22 | Discharge: 2022-10-22 | Disposition: A | Discharge status: Home / Self Care | Payer: Commercial Managed Care - HMO | Attending: Internal Medicine | Admitting: Internal Medicine

## 2022-10-22 DIAGNOSIS — R300 Dysuria: Secondary | ICD-10-CM | POA: Insufficient documentation

## 2022-10-22 DIAGNOSIS — N3001 Acute cystitis with hematuria: Secondary | ICD-10-CM | POA: Diagnosis present

## 2022-10-22 LAB — POCT URINALYSIS DIPSTICK, ED / UC
Bilirubin Urine: NEGATIVE
Glucose, UA: NEGATIVE mg/dL
Nitrite: NEGATIVE
Protein, ur: 100 mg/dL — AB
Specific Gravity, Urine: 1.02 (ref 1.005–1.030)
Urobilinogen, UA: 0.2 mg/dL (ref 0.0–1.0)
pH: 5.5 (ref 5.0–8.0)

## 2022-10-22 LAB — BASIC METABOLIC PANEL
Anion gap: 8 (ref 5–15)
BUN: 14 mg/dL (ref 6–20)
CO2: 24 mmol/L (ref 22–32)
Calcium: 9.7 mg/dL (ref 8.9–10.3)
Chloride: 103 mmol/L (ref 98–111)
Creatinine, Ser: 1.2 mg/dL — ABNORMAL HIGH (ref 0.44–1.00)
GFR, Estimated: 53 mL/min — ABNORMAL LOW (ref >60)
Glucose, Bld: 127 mg/dL — ABNORMAL HIGH (ref 70–99)
Potassium: 4.2 mmol/L (ref 3.5–5.1)
Sodium: 135 mmol/L (ref 135–145)

## 2022-10-22 LAB — CBC
HCT: 40.6 % (ref 36.0–46.0)
Hemoglobin: 12.5 g/dL (ref 12.0–15.0)
MCH: 25.4 pg — ABNORMAL LOW (ref 26.0–34.0)
MCHC: 30.8 g/dL (ref 30.0–36.0)
MCV: 82.5 fL (ref 80.0–100.0)
Platelets: 378 10*3/uL (ref 150–400)
RBC: 4.92 MIL/uL (ref 3.87–5.11)
RDW: 14.9 % (ref 11.5–15.5)
WBC: 8.4 10*3/uL (ref 4.0–10.5)
nRBC: 0 % (ref 0.0–0.2)

## 2022-10-22 MED ORDER — CEPHALEXIN 500 MG PO CAPS: 500.0000 mg | ORAL_CAPSULE | Freq: Four times a day (QID) | ORAL | 0 refills | Status: AC

## 2022-10-22 NOTE — ED Provider Notes (Signed)
[facility]    CSN: [id number] Arrival date & time: 10/22/22  1719      History   Chief Complaint Chief Complaint  Patient presents with   Dysuria    HPI Emma Rhodes is a 57 y.o. female.   Patient presents with urinary burning, bladder pressure, urgency that started about 5 days ago.  Patient has taken Azo with minimal improvement.  Patient also reports malodorous urine.  Denies vaginal discharge, hematuria, abnormal vaginal bleeding, abdominal pain, back pain, fever.  Reports history of "bladder retention" and states that she used to take a daily medication for this.  She is not sure the name of it but attempted to spell it.  States that she no longer takes this medication.   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    Low iron    Measles    Mumps    Ovarian cyst    Vaginitis    Varicose veins     Patient Active Problem List   Diagnosis Date Noted   Bilateral hand pain 09/15/2022   Rash of face 09/15/2022   Encounter for general adult medical examination with abnormal findings 09/15/2022   Joint swelling 08/20/2022   GERD (gastroesophageal reflux disease) 11/25/2015   Knee pain, bilateral 05/05/2011   ADJ DISORDER WITH MIXED ANXIETY & DEPRESSED MOOD 11/12/2009   Asthma, chronic 09/24/2009    Past Surgical History:  Procedure Laterality Date   CESAREAN SECTION     DILATION AND CURETTAGE OF UTERUS      OB History     Gravida  5   Para  4   Term      Preterm      AB      Living         SAB      IAB      Ectopic      Multiple      Live Births               Home Medications    Prior to Admission medications   Medication Sig Start Date End Date Taking? Authorizing Provider  cephALEXin (KEFLEX) 500 MG capsule Take 1 capsule (500 mg total) by mouth 4 (four) times daily for 5 days. 10/22/22  10/27/22 Yes Toleen Lachapelle, Acie Fredrickson, FNP  albuterol (VENTOLIN HFA) 108 (90 Base) MCG/ACT inhaler Inhale 2 puffs into the lungs every 6 (six) hours as needed for wheezing or shortness of breath. 08/19/22   Myrlene Broker, MD  cetirizine (ZYRTEC) 10 MG tablet Take 1 tablet (10 mg total) by mouth daily. 09/12/20   Dahlia Byes A, NP  citalopram (CELEXA) 20 MG tablet Take 1 tablet (20 mg total) by mouth daily. 08/19/22   Myrlene Broker, MD  cyclobenzaprine (FLEXERIL) 5 MG tablet Take 1 tablet (5 mg total) by mouth 2 (two) times daily as needed for muscle spasms. 09/13/22   Myrlene Broker, MD  fluticasone-salmeterol (ADVAIR) 100-50 MCG/ACT AEPB Inhale 1 puff into the lungs 2 (two) times daily. 09/13/22   Myrlene Broker, MD  meloxicam (MOBIC) 15 MG tablet TAKE 1 TABLET (15 MG TOTAL) BY MOUTH DAILY. 10/19/22   Myrlene Broker, MD  montelukast (SINGULAIR) 10 MG tablet Take 1 tablet (10 mg total) by mouth at bedtime. 08/19/22   Hillard Danker  A, MD  omeprazole (PRILOSEC) 40 MG capsule Take 1 capsule (40 mg total) by mouth daily. 08/19/22   Hoyt Koch, MD    Family History Family History  Problem Relation Age of Onset   Dementia Mother     Social History Social History   Tobacco Use   Smoking status: Never   Smokeless tobacco: Never  Substance Use Topics   Alcohol use: No   Drug use: No     Allergies   Patient has no known allergies.   Review of Systems Review of Systems Per HPI  Physical Exam Triage Vital Signs ED Triage Vitals  Enc Vitals Group     BP 10/22/22 1827 124/65     Pulse Rate 10/22/22 1827 91     Resp 10/22/22 1827 18     Temp 10/22/22 1827 98 F (36.7 C)     Temp src --      SpO2 10/22/22 1827 99 %     Weight --      Height --      Head Circumference --      Peak Flow --      Pain Score 10/22/22 1826 8     Pain Loc --      Pain Edu? --      Excl. in [place]? --    No data found.  Updated Vital Signs BP 124/65 (BP Location: Left  Arm)   Pulse 91   Temp 98 F (36.7 C)   Resp 18   SpO2 99%   Visual Acuity Right Eye Distance:   Left Eye Distance:   Bilateral Distance:    Right Eye Near:   Left Eye Near:    Bilateral Near:     Physical Exam Constitutional:      General: She is not in acute distress.    Appearance: Normal appearance. She is not toxic-appearing or diaphoretic.  HENT:     Head: Normocephalic and atraumatic.  Eyes:     Extraocular Movements: Extraocular movements intact.     Conjunctiva/sclera: Conjunctivae normal.  Cardiovascular:     Rate and Rhythm: Normal rate and regular rhythm.     Pulses: Normal pulses.     Heart sounds: Normal heart sounds.  Pulmonary:     Effort: Pulmonary effort is normal. No respiratory distress.     Breath sounds: Normal breath sounds.  Abdominal:     General: Bowel sounds are normal. There is no distension.     Palpations: Abdomen is soft.     Tenderness: There is no abdominal tenderness.  Neurological:     General: No focal deficit present.     Mental Status: She is alert and oriented to person, place, and time. Mental status is at baseline.  Psychiatric:        Mood and Affect: Mood normal.        Behavior: Behavior normal.        Thought Content: Thought content normal.        Judgment: Judgment normal.      UC Treatments / Results  Labs (all labs ordered are listed, but only abnormal results are displayed) Labs Reviewed  POCT URINALYSIS DIPSTICK, ED / UC - Abnormal; Notable for the following components:      Result Value   Ketones, ur TRACE (*)    Hgb urine dipstick LARGE (*)    Protein, ur 100 (*)    Leukocytes,Ua LARGE (*)    All other components within normal limits  URINE CULTURE  BASIC METABOLIC PANEL  CBC    EKG   Radiology No results found.  Procedures Procedures (including critical care time)  Medications Ordered in UC Medications - No data to display  Initial Impression / Assessment and Plan / UC Course  I have  reviewed the triage vital signs and the nursing notes.  Pertinent labs & imaging results that were available during my care of the patient were reviewed by me and considered in my medical decision making (see chart for details).     UA indicating urinary tract infection.  Will treat with cephalexin.  Urine culture pending.  Protein on urine culture is most likely due to infection but will obtain BMP and CBC to ensure no other worrisome etiology.  Patient was advised to follow-up if symptoms persist or worsen.  Patient verbalized understanding and was agreeable with plan. Final Clinical Impressions(s) / UC Diagnoses   Final diagnoses:  Acute cystitis with hematuria  Dysuria     Discharge Instructions      You have a urinary tract infection which is being treated with an antibiotic.  Urine culture and blood work are pending.  We will call if they are abnormal.  Please follow-up if any symptoms persist or worsen.    ED Prescriptions     Medication Sig Dispense Auth. Provider   cephALEXin (KEFLEX) 500 MG capsule Take 1 capsule (500 mg total) by mouth 4 (four) times daily for 5 days. 20 capsule Teodora Medici, [place]      PDMP not reviewed this encounter.   Teodora Medici, [place] 10/22/22 [id number]

## 2022-10-22 NOTE — ED Triage Notes (Signed)
Pt had dysuria and pressure for 5 days. Tried AZO OTC without relief. Reports odor as well. Took Tylenol for pain

## 2022-10-22 NOTE — Discharge Instructions (Signed)
You have a urinary tract infection which is being treated with an antibiotic.  Urine culture and blood work are pending.  We will call if they are abnormal.  Please follow-up if any symptoms persist or worsen.

## 2022-10-23 LAB — URINE CULTURE: Culture: <10000 — AB

## 2022-11-16 ENCOUNTER — Other Ambulatory Visit: Payer: Self-pay | Admitting: Internal Medicine

## 2022-11-17 ENCOUNTER — Other Ambulatory Visit: Payer: Self-pay | Admitting: Internal Medicine

## 2022-12-07 ENCOUNTER — Other Ambulatory Visit: Payer: Self-pay | Admitting: Internal Medicine

## 2022-12-13 ENCOUNTER — Ambulatory Visit: Payer: Commercial Managed Care - HMO | Admitting: Internal Medicine

## 2022-12-20 ENCOUNTER — Ambulatory Visit: Payer: Self-pay | Admitting: Internal Medicine

## 2022-12-27 ENCOUNTER — Ambulatory Visit: Payer: Commercial Managed Care - HMO | Admitting: Internal Medicine

## 2022-12-27 ENCOUNTER — Encounter: Payer: Self-pay | Admitting: Internal Medicine

## 2022-12-27 VITALS — BP 100/80 | HR 73 | Temp 98.1°F | Ht 66.0 in | Wt 199.0 lb

## 2022-12-27 DIAGNOSIS — R3915 Urgency of urination: Secondary | ICD-10-CM

## 2022-12-27 DIAGNOSIS — E669 Obesity, unspecified: Secondary | ICD-10-CM | POA: Diagnosis not present

## 2022-12-27 DIAGNOSIS — R5382 Chronic fatigue, unspecified: Secondary | ICD-10-CM

## 2022-12-27 DIAGNOSIS — R739 Hyperglycemia, unspecified: Secondary | ICD-10-CM

## 2022-12-27 DIAGNOSIS — Z6830 Body mass index (BMI) 30.0-30.9, adult: Secondary | ICD-10-CM

## 2022-12-27 DIAGNOSIS — R21 Rash and other nonspecific skin eruption: Secondary | ICD-10-CM

## 2022-12-27 LAB — COMPREHENSIVE METABOLIC PANEL
ALT: 20 U/L (ref 0–35)
AST: 20 U/L (ref 0–37)
Albumin: 3.7 g/dL (ref 3.5–5.2)
Alkaline Phosphatase: 124 U/L — ABNORMAL HIGH (ref 39–117)
BUN: 13 mg/dL (ref 6–23)
CO2: 27 mEq/L (ref 19–32)
Calcium: 9.5 mg/dL (ref 8.4–10.5)
Chloride: 103 mEq/L (ref 96–112)
Creatinine, Ser: 0.99 mg/dL (ref 0.40–1.20)
GFR: 63.78 mL/min (ref >60.00)
Glucose, Bld: 108 mg/dL — ABNORMAL HIGH (ref 70–99)
Potassium: 4.1 mEq/L (ref 3.5–5.1)
Sodium: 138 mEq/L (ref 135–145)
Total Bilirubin: 0.2 mg/dL (ref 0.2–1.2)
Total Protein: 8 g/dL (ref 6.0–8.3)

## 2022-12-27 LAB — HEMOGLOBIN A1C: Hgb A1c MFr Bld: 6.2 % (ref 4.6–6.5)

## 2022-12-27 LAB — VITAMIN B12: Vitamin B-12: 1029 pg/mL — ABNORMAL HIGH (ref 211–911)

## 2022-12-27 LAB — VITAMIN D 25 HYDROXY (VIT D DEFICIENCY, FRACTURES): VITD: 18.82 ng/mL — ABNORMAL LOW (ref 30.00–100.00)

## 2022-12-27 LAB — URINALYSIS, ROUTINE W REFLEX MICROSCOPIC

## 2022-12-27 LAB — TSH: TSH: 6.93 u[IU]/mL — ABNORMAL HIGH (ref 0.35–5.50)

## 2022-12-27 MED ORDER — SEMAGLUTIDE-WEIGHT MANAGEMENT 1.7 MG/0.75ML ~~LOC~~ SOAJ: 1.7000 mg | SUBCUTANEOUS | 0 refills | Status: AC

## 2022-12-27 MED ORDER — SEMAGLUTIDE-WEIGHT MANAGEMENT 0.25 MG/0.5ML ~~LOC~~ SOAJ: 0.2500 mg | SUBCUTANEOUS | 0 refills | Status: AC

## 2022-12-27 MED ORDER — SEMAGLUTIDE-WEIGHT MANAGEMENT 1 MG/0.5ML ~~LOC~~ SOAJ: 1.0000 mg | SUBCUTANEOUS | 0 refills | Status: AC

## 2022-12-27 MED ORDER — SEMAGLUTIDE-WEIGHT MANAGEMENT 0.5 MG/0.5ML ~~LOC~~ SOAJ: 0.5000 mg | SUBCUTANEOUS | 0 refills | Status: AC

## 2022-12-27 MED ORDER — SEMAGLUTIDE-WEIGHT MANAGEMENT 2.4 MG/0.75ML ~~LOC~~ SOAJ: 2.4000 mg | SUBCUTANEOUS | 0 refills | Status: AC

## 2022-12-27 NOTE — Patient Instructions (Addendum)
We will check the labs today and you can stop the spironolactone medicine.

## 2022-12-27 NOTE — Progress Notes (Signed)
   Subjective:   Patient ID: Emma Rhodes, female    DOB: 03-18-1966, 57 y.o.   MRN: [id number]  HPI The patient is a 57 YO female coming in for several concerns.  Review of Systems  Constitutional:  Positive for unexpected weight change.  HENT:  Positive for congestion and sneezing.   Eyes: Negative.   Respiratory:  Negative for cough, chest tightness and shortness of breath.   Cardiovascular:  Negative for chest pain, palpitations and leg swelling.  Gastrointestinal:  Negative for abdominal distention, abdominal pain, constipation, diarrhea, nausea and vomiting.  Genitourinary:  Positive for frequency and urgency.  Musculoskeletal:  Positive for arthralgias and myalgias.  Skin: Negative.   Neurological: Negative.   Psychiatric/Behavioral: Negative.      Objective:  Physical Exam Constitutional:      Appearance: She is well-developed.  HENT:     Head: Normocephalic and atraumatic.  Cardiovascular:     Rate and Rhythm: Normal rate and regular rhythm.  Pulmonary:     Effort: Pulmonary effort is normal. No respiratory distress.     Breath sounds: Normal breath sounds. No wheezing or rales.  Abdominal:     General: Bowel sounds are normal. There is no distension.     Palpations: Abdomen is soft.     Tenderness: There is no abdominal tenderness. There is no rebound.  Musculoskeletal:        General: Tenderness present.     Cervical back: Normal range of motion.  Skin:    General: Skin is warm and dry.  Neurological:     Mental Status: She is alert and oriented to person, place, and time.     Coordination: Coordination normal.     Vitals:   12/27/22 1037  BP: 100/80  Pulse: 73  Temp: 98.1 F (36.7 C)  TempSrc: Oral  SpO2: 98%  Weight: 199 lb (90.3 kg)  Height: 5\' 6"  (1.676 m)    Assessment & Plan:  Visit time 25 minutes in face to face communication with patient and coordination of care, additional 5 minutes spent in record review, coordination or care,  ordering tests, communicating/referring to other healthcare professionals, documenting in medical records all on the same day of the visit for total time 30 minutes spent on the visit.

## 2022-12-28 ENCOUNTER — Telehealth: Payer: Self-pay | Admitting: Internal Medicine

## 2022-12-28 DIAGNOSIS — R5383 Other fatigue: Secondary | ICD-10-CM | POA: Insufficient documentation

## 2022-12-28 DIAGNOSIS — R3915 Urgency of urination: Secondary | ICD-10-CM | POA: Insufficient documentation

## 2022-12-28 DIAGNOSIS — E669 Obesity, unspecified: Secondary | ICD-10-CM | POA: Insufficient documentation

## 2022-12-28 DIAGNOSIS — R739 Hyperglycemia, unspecified: Secondary | ICD-10-CM | POA: Insufficient documentation

## 2022-12-28 LAB — URINE CULTURE

## 2022-12-28 NOTE — Assessment & Plan Note (Signed)
On recent labs sugar elevated checking HgA1c today. If elevated metformin would be a good option to help with weight loss.

## 2022-12-28 NOTE — Assessment & Plan Note (Signed)
Checking TSH and vitamin B12 and D since not recently checked. She does also have joint pain and was checked for auto-immune disease without positive workup.

## 2022-12-28 NOTE — Assessment & Plan Note (Signed)
She wishes to try medication for weight loss. Rx wegovy standard titration and if not covered we would switch to another agent.

## 2022-12-28 NOTE — Assessment & Plan Note (Signed)
Checking U/A and culture. She has taken otc which can mask results. Treat as appropriate.

## 2022-12-28 NOTE — Assessment & Plan Note (Signed)
Overall resolving and she was started on spironolactone by gyn and she is unsure why. States it was because of rash. I am unaware of this treating rash. It can be used to treat female hair loss or acne which she does not have. She was not rechecked and needs CMP today to check K and renal function. I have asked her to stop this medicine as I have no indication and she does not intend to return to that ob/gyn.

## 2022-12-28 NOTE — Telephone Encounter (Signed)
Pt called in inquiring about her test results pt stated that she is still in pain. Pt want a nurse to call her so can she can know what to do or prescribe her some medicine for her.

## 2022-12-28 NOTE — Telephone Encounter (Signed)
Called pt no answer LMOM RTC.../lmb 

## 2022-12-29 NOTE — Telephone Encounter (Signed)
Called pt no answer LMOM RTC.../lmb 

## 2022-12-30 NOTE — Telephone Encounter (Signed)
Called pt again still no answer LMOM to call and make f/u appt w/ MD if still need.Marland KitchenJohny Rhodes

## 2023-01-10 ENCOUNTER — Encounter: Payer: Self-pay | Admitting: Nurse Practitioner

## 2023-01-30 ENCOUNTER — Encounter (HOSPITAL_COMMUNITY): Payer: Self-pay

## 2023-01-30 ENCOUNTER — Ambulatory Visit (HOSPITAL_COMMUNITY)
Admission: EM | Admit: 2023-01-30 | Discharge: 2023-01-30 | Disposition: A | Discharge status: Home / Self Care | Payer: Commercial Managed Care - HMO | Attending: Emergency Medicine | Admitting: Emergency Medicine

## 2023-01-30 DIAGNOSIS — H9201 Otalgia, right ear: Secondary | ICD-10-CM | POA: Diagnosis not present

## 2023-01-30 DIAGNOSIS — R112 Nausea with vomiting, unspecified: Secondary | ICD-10-CM | POA: Diagnosis not present

## 2023-01-30 MED ORDER — ONDANSETRON 4 MG PO TBDP: 4.0000 mg | ORAL_TABLET | Freq: Four times a day (QID) | ORAL | 0 refills | Status: AC | PRN

## 2023-01-30 MED ORDER — CETIRIZINE HCL 10 MG PO TABS: 10.0000 mg | ORAL_TABLET | Freq: Every day | ORAL | 2 refills | Status: DC

## 2023-01-30 MED ORDER — FLUTICASONE PROPIONATE 50 MCG/ACT NA SUSP: 2.0000 | Freq: Every day | NASAL | 2 refills | Status: AC

## 2023-01-30 MED ORDER — ONDANSETRON 4 MG PO TBDP
ORAL_TABLET | ORAL | Status: AC
  Filled 2023-01-30: qty 1

## 2023-01-30 MED ORDER — ONDANSETRON 4 MG PO TBDP
4.0000 mg | ORAL_TABLET | Freq: Once | ORAL | Status: AC
  Administered 2023-01-30: 4 mg via ORAL

## 2023-01-30 NOTE — ED Provider Notes (Signed)
[facility]    CSN: [id number] Arrival date & time: 01/30/23  1642      History   Chief Complaint Chief Complaint  Patient presents with   Otalgia    HPI Emma Rhodes is a 58 y.o. female.  Here with 1 day history of nausea and vomiting.  Yesterday she had an episode of emesis after eating an egg and cheese bagel.  She has been able to keep fluids down since then.  Nausea persisting today but no vomiting.  Denies any diarrhea or constipation.  No abdominal pain.  Also having some right ear pain, feeling a little dizzy.  She denies runny nose, nasal congestion, cough, headache, fever or chills. Takes a daily Zyrtec  No known sick contacts. No recent travel. 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    Low iron    Measles    Mumps    Ovarian cyst    Vaginitis    Varicose veins     Patient Active Problem List   Diagnosis Date Noted   Urinary urgency 12/28/2022   Elevated blood sugar 12/28/2022   Class 1 obesity without serious comorbidity with body mass index (BMI) of 30.0 to 30.9 in adult 12/28/2022   Fatigue 12/28/2022   Bilateral hand pain 09/15/2022   Rash of face 09/15/2022   Encounter for general adult medical examination with abnormal findings 09/15/2022   Joint swelling 08/20/2022   GERD (gastroesophageal reflux disease) 11/25/2015   Knee pain, bilateral 05/05/2011   ADJ DISORDER WITH MIXED ANXIETY & DEPRESSED MOOD 11/12/2009   Asthma, chronic 09/24/2009    Past Surgical History:  Procedure Laterality Date   CESAREAN SECTION     DILATION AND CURETTAGE OF UTERUS      OB History     Gravida  5   Para  4   Term      Preterm      AB      Living         SAB      IAB      Ectopic      Multiple      Live Births               Home Medications    Prior to Admission medications    Medication Sig Start Date End Date Taking? Authorizing Provider  albuterol (VENTOLIN HFA) 108 (90 Base) MCG/ACT inhaler TAKE 2 PUFFS BY MOUTH EVERY 6 HOURS AS NEEDED FOR WHEEZE OR SHORTNESS OF BREATH 12/07/22  Yes Myrlene Broker, MD  cetirizine (ZYRTEC ALLERGY) 10 MG tablet Take 1 tablet (10 mg total) by mouth daily. 01/30/23  Yes Eneida Evers, Lurena Joiner, PA-C  citalopram (CELEXA) 20 MG tablet Take 1 tablet (20 mg total) by mouth daily. 08/19/22  Yes Myrlene Broker, MD  cyclobenzaprine (FLEXERIL) 5 MG tablet TAKE 1 TABLET BY MOUTH 2 TIMES DAILY AS NEEDED FOR MUSCLE SPASMS. 11/17/22  Yes Myrlene Broker, MD  fluticasone [facility]) 50 MCG/ACT nasal spray Place 2 sprays into both nostrils daily. 01/30/23  Yes Andric Kerce, Lurena Joiner, PA-C  omeprazole (PRILOSEC) 40 MG capsule Take 1 capsule (40 mg total) by mouth daily. 08/19/22  Yes Myrlene Broker, MD  ondansetron (ZOFRAN-ODT) 4 MG disintegrating tablet Take 1 tablet (4 mg total) by mouth every 6 (six) hours  as needed for nausea or vomiting. 01/30/23  Yes Paxon Propes, Lurena Joiner, PA-C  trimethoprim (TRIMPEX) 100 MG tablet Take 100 mg by mouth daily. 12/14/22  Yes [provider]  fluticasone-salmeterol (ADVAIR) 100-50 MCG/ACT AEPB Inhale 1 puff into the lungs 2 (two) times daily. 09/13/22   Myrlene Broker, MD  Semaglutide-Weight Management 0.5 MG/0.5ML SOAJ Inject 0.5 mg into the skin once a week for 28 days. 01/25/23 02/22/23  Myrlene Broker, MD  Semaglutide-Weight Management 1 MG/0.5ML SOAJ Inject 1 mg into the skin once a week for 28 days. 02/23/23 03/23/23  Myrlene Broker, MD  Semaglutide-Weight Management 1.7 MG/0.75ML SOAJ Inject 1.7 mg into the skin once a week for 28 days. 03/24/23 04/21/23  Myrlene Broker, MD  Semaglutide-Weight Management 2.4 MG/0.75ML SOAJ Inject 2.4 mg into the skin once a week for 28 days. 04/22/23 05/20/23  Myrlene Broker, MD  YUVAFEM 10 MCG TABS vaginal tablet Place 1 tablet vaginally 2 (two)  times a week. 12/13/22   [provider]    Family History Family History  Problem Relation Age of Onset   Dementia Mother     Social History Social History   Tobacco Use   Smoking status: Never   Smokeless tobacco: Never  Substance Use Topics   Alcohol use: No   Drug use: No     Allergies   Patient has no known allergies.   Review of Systems Review of Systems HPI  Physical Exam Triage Vital Signs ED Triage Vitals  Enc Vitals Group     BP 01/30/23 1720 111/71     Pulse Rate 01/30/23 1720 85     Resp 01/30/23 1720 16     Temp 01/30/23 1720 98.3 F (36.8 C)     Temp Source 01/30/23 1720 Oral     SpO2 01/30/23 1720 94 %     Weight 01/30/23 1719 195 lb (88.5 kg)     Height 01/30/23 1719 5' 4.96" (1.65 m)     Head Circumference --      Peak Flow --      Pain Score 01/30/23 1718 6     Pain Loc --      Pain Edu? --      Excl. in GC? --    No data found.  Updated Vital Signs BP 111/71 (BP Location: Right Arm)   Pulse 85   Temp 98.3 F (36.8 C) (Oral)   Resp 16   Ht 5' 4.96" (1.65 m)   Wt 195 lb (88.5 kg)   SpO2 94%   BMI 32.49 kg/m   Visual Acuity Right Eye Distance:   Left Eye Distance:   Bilateral Distance:    Right Eye Near:   Left Eye Near:    Bilateral Near:     Physical Exam Vitals and nursing note reviewed.  Constitutional:      General: She is not in acute distress.    Appearance: She is not ill-appearing.  HENT:     Head: Normocephalic and atraumatic.     Right Ear: Tympanic membrane, ear canal and external ear normal.     Left Ear: Tympanic membrane, ear canal and external ear normal.     Nose: Nose normal.     Mouth/Throat:     Mouth: Mucous membranes are moist.     Pharynx: Oropharynx is clear.  Eyes:     Extraocular Movements: Extraocular movements intact.     Conjunctiva/sclera: Conjunctivae normal.     Pupils: Pupils are  equal, round, and reactive to light.  Cardiovascular:     Rate and Rhythm: Normal rate and  regular rhythm.     Heart sounds: Normal heart sounds.  Pulmonary:     Effort: Pulmonary effort is normal. No respiratory distress.     Breath sounds: Normal breath sounds.  Abdominal:     Palpations: Abdomen is soft.     Tenderness: There is no abdominal tenderness. There is no guarding or rebound.  Musculoskeletal:        General: Normal range of motion.     Cervical back: Normal range of motion.  Neurological:     General: No focal deficit present.     Mental Status: She is alert and oriented to person, place, and time.     Cranial Nerves: Cranial nerves 2-12 are intact.     Sensory: Sensation is intact. No sensory deficit.     Motor: Motor function is intact. No weakness.     Coordination: Coordination is intact. Coordination normal.     Gait: Gait normal.     UC Treatments / Results  Labs (all labs ordered are listed, but only abnormal results are displayed) Labs Reviewed - No data to display  EKG  Radiology No results found.  Procedures Procedures   Medications Ordered in UC Medications  ondansetron (ZOFRAN-ODT) disintegrating tablet 4 mg (4 mg Oral Given 01/30/23 1748)    Initial Impression / Assessment and Plan / UC Course  I have reviewed the triage vital signs and the nursing notes.  Pertinent labs & imaging results that were available during my care of the patient were reviewed by me and considered in my medical decision making (see chart for details).  Afebrile, well appearing and no red flags Zofran ODT given with improvement, p.o. challenge successful.  Discussed using every 6 hours as needed, increasing fluids as much as tolerated.  Bland diet. No sign of ear infection, consider eustachian tube dysfunction which may also contribute to her dizziness.  Neurologically intact.  Recommend continuing daily Zyrtec, add nasal spray.  Return precautions discussed.  Patient agreeable with plan  Final Clinical Impressions(s) / UC Diagnoses   Final diagnoses:   Nausea and vomiting, unspecified vomiting type  Right ear pain     Discharge Instructions      For the nausea, you can take the Zofran every 6 hours as needed to settle the stomach.  Make sure you are drinking lots of fluids.  I recommend a bland diet over the next few days.  For your ear pain, I recommend to take once daily cetirizine in combination with once daily nasal spray.  This should help to relieve pressure. Give it a few days to improve.   You can return if symptoms are persisting    ED Prescriptions     Medication Sig Dispense Auth. Provider   ondansetron (ZOFRAN-ODT) 4 MG disintegrating tablet Take 1 tablet (4 mg total) by mouth every 6 (six) hours as needed for nausea or vomiting. 20 tablet Palmira Stickle, PA-C   cetirizine (ZYRTEC ALLERGY) 10 MG tablet Take 1 tablet (10 mg total) by mouth daily. 30 tablet Graziella Connery, PA-C   fluticasone (FLONASE) 50 MCG/ACT nasal spray Place 2 sprays into both nostrils daily. 9.9 mL Laurieanne Galloway, Lurena Joiner, PA-C      PDMP not reviewed this encounter.   Teigan Manner, Ray Church 01/30/23 1811

## 2023-01-30 NOTE — ED Triage Notes (Signed)
Patient here today with c/o dizziness X 2 days. She has been nauseous all yesterday and today. She has also been having some right ear pain and HA. Has not tried taking any medication.

## 2023-01-30 NOTE — Discharge Instructions (Addendum)
For the nausea, you can take the Zofran every 6 hours as needed to settle the stomach.  Make sure you are drinking lots of fluids.  I recommend a bland diet over the next few days.  For your ear pain, I recommend to take once daily cetirizine in combination with once daily nasal spray.  This should help to relieve pressure. Give it a few days to improve.   You can return if symptoms are persisting

## 2023-02-22 ENCOUNTER — Ambulatory Visit: Payer: Commercial Managed Care - HMO | Admitting: Nurse Practitioner

## 2023-07-11 ENCOUNTER — Telehealth: Payer: Self-pay | Admitting: Internal Medicine

## 2023-07-11 NOTE — Telephone Encounter (Signed)
Prescription Request  07/11/2023  LOV: 12/27/2022  What is the name of the medication or equipment? cetirizine (ZYRTEC ALLERGY) 10 MG tablet   Have you contacted your pharmacy to request a refill? Yes   Which pharmacy would you like this sent to?  [id number] - [place], [place] - [address] AT [facility] OF [address] [place] [address] Phone: [phone] Fax: [phone]    Patient notified that their request is being sent to the clinical staff for review and that they should receive a response within 2 business days.   Please advise at Mobile [phone] (mobile)

## 2023-07-12 ENCOUNTER — Other Ambulatory Visit: Payer: Self-pay

## 2023-07-12 MED ORDER — CETIRIZINE HCL 10 MG PO TABS: 10.0000 mg | ORAL_TABLET | Freq: Every day | ORAL | 2 refills | Status: DC

## 2023-07-12 NOTE — Telephone Encounter (Signed)
Ok to refill 

## 2023-08-30 ENCOUNTER — Ambulatory Visit: Payer: Self-pay | Admitting: Internal Medicine

## 2023-09-03 ENCOUNTER — Other Ambulatory Visit: Payer: Self-pay | Admitting: Internal Medicine

## 2023-09-06 ENCOUNTER — Ambulatory Visit: Payer: 59 | Admitting: Internal Medicine

## 2023-09-10 ENCOUNTER — Other Ambulatory Visit: Payer: Self-pay | Admitting: Internal Medicine

## 2023-11-10 ENCOUNTER — Encounter (HOSPITAL_COMMUNITY): Payer: Self-pay

## 2023-11-10 ENCOUNTER — Ambulatory Visit (HOSPITAL_COMMUNITY)
Admission: EM | Admit: 2023-11-10 | Discharge: 2023-11-10 | Disposition: A | Discharge status: Home / Self Care | Payer: No Typology Code available for payment source | Attending: Emergency Medicine | Admitting: Emergency Medicine

## 2023-11-10 DIAGNOSIS — J309 Allergic rhinitis, unspecified: Secondary | ICD-10-CM

## 2023-11-10 DIAGNOSIS — J45909 Unspecified asthma, uncomplicated: Secondary | ICD-10-CM

## 2023-11-10 LAB — POC COVID19/FLU A&B COMBO
Covid Antigen, POC: NEGATIVE
Influenza A Antigen, POC: NEGATIVE
Influenza B Antigen, POC: NEGATIVE

## 2023-11-10 NOTE — ED Triage Notes (Signed)
Pt c/o runny nose and chest tightness for almost 2 months. States has been to her allergist and pulmonologist and had some meds changed with no relief. States has SOB at night. NAD.

## 2023-11-10 NOTE — Discharge Instructions (Addendum)
Your COVID-19 test is negative.  Please read below to learn more about the medications, dosages and frequencies that I recommend to help alleviate your symptoms and to get you feeling better soon:   Zyrtec (cetirizine): This is an excellent second-generation antihistamine that helps to reduce respiratory inflammatory response to environmental allergens.  In some patients, this medication can cause daytime sleepiness so I recommend that you take 1 tablet daily at bedtime.     Flonase (fluticasone): This is a steroid nasal spray that used once daily, 1 spray in each nare.  This works best when used on a daily basis. This medication does not work well if it is only used when you think you need it.  After 3 to 5 days of use, you will notice significant reduction of the inflammation and mucus production that is currently being caused by exposure to allergens, whether seasonal or environmental.  The most common side effect of this medication is nosebleeds.  If you experience a nosebleed, please discontinue use for 1 week, then feel free to resume.  If you find that your insurance will not pay for this medication, please consider a different nasal steroids such as Nasonex (mometasone), or Nasacort (triamcinolone).   ProAir, Ventolin, Proventil (albuterol): This inhaled medication contains a short acting beta agonist bronchodilator.  This medication relaxes the smooth muscle of the airway in the lungs.  When these muscles are tight, breathing becomes more constricted.  The result of relaxation of the smooth muscle is increased air movement and improved work of breathing.  This is a short acting medication that can be used every 4-6 hours as needed for increased work of breathing, shortness of breath, wheezing and excessive coughing.  It comes in the form of a handheld inhaler or nebulizer solution.  I recommended that for the next 3 to 4 days, this medication is used 4 times daily on a scheduled basis then decrease  to twice daily and as needed until symptoms have completely resolved which I anticipate will be several weeks.   Advair (fluticasone and salmeterol): Please inhale 2 puffs twice daily.  This inhaled medication contains a corticosteroid and long-acting form of albuterol.  The inhaled steroid and this medication  is not absorbed into the body and will not cause side effects such as increased blood sugar levels, irritability, sleeplessness or weight gain.  Inhaled corticosteroid are sort of like topical steroid creams but, as you can imagine, it is not practical to attempt to rub a steroid cream inside of your lungs.  The long-acting albuterol works similarly to the short acting albuterol found in your rescue inhaler but provides 24-hour relaxation of the smooth muscles that open and constrict your airways; your short acting rescue inhaler can only provide for a few hours this benefit for a few hours.  Please feel free to continue using your short acting rescue inhaler as often as needed throughout the day for shortness of breath, wheezing, and cough.  All of these medications that I have listed above only work if you use them every day.  Do not skip doses.  If symptoms have not meaningfully improved in the next 5 to 7 days, please follow-up with your primary care provider.  If symptoms have worsened in the next 5 to 7 days, please go to the emergency room for further evaluation.  Thank you for visiting urgent care today.  We appreciate the opportunity to participate in your care.

## 2023-11-10 NOTE — ED Provider Notes (Signed)
[facility]    CSN: [id number] Arrival date & time: 11/10/23  1929    HISTORY   Chief Complaint  Patient presents with   Chest Pain   HPI Emma Rhodes is a pleasant, 58 y.o. female who presents to urgent care today. Pt c/o runny nose and chest tightness for almost 2 months. States has been to her allergist and pulmonologist and had some meds changed with no relief. States has SOB at night.  Patient has had extensive allergy testing, EKG and chest x-ray, all of which were normal.  Patient has normal vital signs on arrival today.  Patient has been prescribed ICS/LABA, albuterol, cetirizine and Flonase.  Patient states not currently taking any of them.  Patient states that she is concerned she may have COVID-19.  The history is provided by the patient.   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    Low iron    Measles    Mumps    Ovarian cyst    Vaginitis    Varicose veins    Patient Active Problem List   Diagnosis Date Noted   Urinary urgency 12/28/2022   Elevated blood sugar 12/28/2022   Class 1 obesity without serious comorbidity with body mass index (BMI) of 30.0 to 30.9 in adult 12/28/2022   Fatigue 12/28/2022   Bilateral hand pain 09/15/2022   Rash of face 09/15/2022   Encounter for general adult medical examination with abnormal findings 09/15/2022   Joint swelling 08/20/2022   GERD (gastroesophageal reflux disease) 11/25/2015   Knee pain, bilateral 05/05/2011   ADJ DISORDER WITH MIXED ANXIETY & DEPRESSED MOOD 11/12/2009   Asthma, chronic 09/24/2009   Past Surgical History:  Procedure Laterality Date   CESAREAN SECTION     DILATION AND CURETTAGE OF UTERUS     OB History     Gravida  5   Para  4   Term      Preterm      AB      Living         SAB      IAB      Ectopic      Multiple      Live Births              Home Medications    Prior to Admission medications   Medication Sig Start Date End Date Taking? Authorizing Provider  albuterol (VENTOLIN HFA) 108 (90 Base) MCG/ACT inhaler TAKE 2 PUFFS BY MOUTH EVERY 6 HOURS AS NEEDED FOR WHEEZE OR SHORTNESS OF BREATH 12/07/22   Myrlene Broker, MD  cetirizine (ZYRTEC ALLERGY) 10 MG tablet Take 1 tablet (10 mg total) by mouth daily. 07/12/23   Myrlene Broker, MD  citalopram (CELEXA) 20 MG tablet TAKE 1 TABLET BY MOUTH EVERY DAY 09/05/23   Myrlene Broker, MD  cyclobenzaprine (FLEXERIL) 5 MG tablet TAKE 1 TABLET BY MOUTH 2 TIMES DAILY AS NEEDED FOR MUSCLE SPASMS. 11/17/22   Myrlene Broker, MD  fluticasone [facility]) 50 MCG/ACT nasal spray Place 2 sprays into both nostrils daily. 01/30/23   Rising, Lurena Joiner, PA-C  fluticasone-salmeterol (ADVAIR) 100-50 MCG/ACT AEPB Inhale 1 puff into the lungs 2 (two) times daily. 09/13/22   Myrlene Broker, MD  omeprazole (PRILOSEC) 40 MG capsule TAKE 1 CAPSULE (40 MG TOTAL) BY MOUTH DAILY. 09/12/23  Myrlene Broker, MD  ondansetron (ZOFRAN-ODT) 4 MG disintegrating tablet Take 1 tablet (4 mg total) by mouth every 6 (six) hours as needed for nausea or vomiting. 01/30/23   Rising, Lurena Joiner, PA-C  trimethoprim (TRIMPEX) 100 MG tablet Take 100 mg by mouth daily. 12/14/22   [provider]  YUVAFEM 10 MCG TABS vaginal tablet Place 1 tablet vaginally 2 (two) times a week. 12/13/22   [provider]    Family History Family History  Problem Relation Age of Onset   Dementia Mother    Social History Social History   Tobacco Use   Smoking status: Never   Smokeless tobacco: Never  Substance Use Topics   Alcohol use: No   Drug use: No   Allergies   Patient has no known allergies.  Review of Systems Review of Systems Pertinent findings revealed after performing a 14 point review of systems has been noted in the history of present illness.  Physical Exam Vital Signs BP 119/88  (BP Location: Left Arm)   Pulse 84   Temp 98.3 F (36.8 C) (Oral)   Resp 18   SpO2 96%   No data found.  Physical Exam Vitals and nursing note reviewed.  Constitutional:      General: She is not in acute distress.    Appearance: Normal appearance. She is not ill-appearing.  HENT:     Head: Normocephalic and atraumatic.     Salivary Glands: Right salivary gland is not diffusely enlarged or tender. Left salivary gland is not diffusely enlarged or tender.     Right Ear: Ear canal and external ear normal. No drainage. A middle ear effusion is present. There is no impacted cerumen. Tympanic membrane is bulging. Tympanic membrane is not injected or erythematous.     Left Ear: Ear canal and external ear normal. No drainage. A middle ear effusion is present. There is no impacted cerumen. Tympanic membrane is bulging. Tympanic membrane is not injected or erythematous.     Ears:     Comments: Bilateral EACs normal, both TMs bulging with clear fluid    Nose: Rhinorrhea present. No nasal deformity, septal deviation, signs of injury, nasal tenderness, mucosal edema or congestion. Rhinorrhea is clear.     Right Nostril: Occlusion present. No foreign body, epistaxis or septal hematoma.     Left Nostril: Occlusion present. No foreign body, epistaxis or septal hematoma.     Right Turbinates: Enlarged, swollen and pale.     Left Turbinates: Enlarged, swollen and pale.     Right Sinus: No maxillary sinus tenderness or frontal sinus tenderness.     Left Sinus: No maxillary sinus tenderness or frontal sinus tenderness.     Mouth/Throat:     Lips: Pink. No lesions.     Mouth: Mucous membranes are moist. No oral lesions.     Pharynx: Oropharynx is clear. Uvula midline. No posterior oropharyngeal erythema or uvula swelling.     Tonsils: No tonsillar exudate. 0 on the right. 0 on the left.     Comments: Postnasal drip Eyes:     General: Lids are normal.        Right eye: No discharge.        Left eye: No  discharge.     Extraocular Movements: Extraocular movements intact.     Conjunctiva/sclera: Conjunctivae normal.     Right eye: Right conjunctiva is not injected.     Left eye: Left conjunctiva is not injected.  Neck:  Trachea: Trachea and phonation normal.  Cardiovascular:     Rate and Rhythm: Normal rate and regular rhythm.     Pulses: Normal pulses.     Heart sounds: Normal heart sounds. No murmur heard.    No friction rub. No gallop.  Pulmonary:     Effort: Pulmonary effort is normal. No accessory muscle usage, prolonged expiration or respiratory distress.     Breath sounds: Normal breath sounds. No stridor, decreased air movement or transmitted upper airway sounds. No decreased breath sounds, wheezing, rhonchi or rales.  Chest:     Chest wall: No tenderness.  Musculoskeletal:        General: Normal range of motion.     Cervical back: Normal range of motion and neck supple. Normal range of motion.  Lymphadenopathy:     Cervical: No cervical adenopathy.  Skin:    General: Skin is warm and dry.     Findings: No erythema or rash.  Neurological:     General: No focal deficit present.     Mental Status: She is alert and oriented to person, place, and time.  Psychiatric:        Mood and Affect: Mood normal.        Behavior: Behavior normal.     Visual Acuity Right Eye Distance:   Left Eye Distance:   Bilateral Distance:    Right Eye Near:   Left Eye Near:    Bilateral Near:     UC Couse / Diagnostics / Procedures:     Radiology No results found.  Procedures Procedures (including critical care time) EKG  Pending results:  Labs Reviewed  POC COVID19/FLU A&B COMBO    Medications Ordered in UC: Medications - No data to display  UC Diagnoses / Final Clinical Impressions(s)   I have reviewed the triage vital signs and the nursing notes.  Pertinent labs & imaging results that were available during my care of the patient were reviewed by me and considered in  my medical decision making (see chart for details).    Final diagnoses:  Allergic rhinitis, unspecified seasonality, unspecified trigger  Uncomplicated asthma, unspecified asthma severity, unspecified whether persistent   COVID-19 test today was negative.  Influenza testing negative.  Physical exam findings concerning for allergic rhinitis.  Patient reminded to use all of her allergy and asthma medications every day, not just "as needed".  Please see discharge instructions below for details of plan of care as provided to patient. ED Prescriptions   None    PDMP not reviewed this encounter.  Pending results:  Labs Reviewed  POC COVID19/FLU A&B COMBO      Discharge Instructions      Your COVID-19 test is negative.  Please read below to learn more about the medications, dosages and frequencies that I recommend to help alleviate your symptoms and to get you feeling better soon:   Zyrtec (cetirizine): This is an excellent second-generation antihistamine that helps to reduce respiratory inflammatory response to environmental allergens.  In some patients, this medication can cause daytime sleepiness so I recommend that you take 1 tablet daily at bedtime.     Flonase (fluticasone): This is a steroid nasal spray that used once daily, 1 spray in each nare.  This works best when used on a daily basis. This medication does not work well if it is only used when you think you need it.  After 3 to 5 days of use, you will notice significant reduction of the inflammation and mucus  production that is currently being caused by exposure to allergens, whether seasonal or environmental.  The most common side effect of this medication is nosebleeds.  If you experience a nosebleed, please discontinue use for 1 week, then feel free to resume.  If you find that your insurance will not pay for this medication, please consider a different nasal steroids such as Nasonex (mometasone), or Nasacort (triamcinolone).    ProAir, Ventolin, Proventil (albuterol): This inhaled medication contains a short acting beta agonist bronchodilator.  This medication relaxes the smooth muscle of the airway in the lungs.  When these muscles are tight, breathing becomes more constricted.  The result of relaxation of the smooth muscle is increased air movement and improved work of breathing.  This is a short acting medication that can be used every 4-6 hours as needed for increased work of breathing, shortness of breath, wheezing and excessive coughing.  It comes in the form of a handheld inhaler or nebulizer solution.  I recommended that for the next 3 to 4 days, this medication is used 4 times daily on a scheduled basis then decrease to twice daily and as needed until symptoms have completely resolved which I anticipate will be several weeks.   Advair (fluticasone and salmeterol): Please inhale 2 puffs twice daily.  This inhaled medication contains a corticosteroid and long-acting form of albuterol.  The inhaled steroid and this medication  is not absorbed into the body and will not cause side effects such as increased blood sugar levels, irritability, sleeplessness or weight gain.  Inhaled corticosteroid are sort of like topical steroid creams but, as you can imagine, it is not practical to attempt to rub a steroid cream inside of your lungs.  The long-acting albuterol works similarly to the short acting albuterol found in your rescue inhaler but provides 24-hour relaxation of the smooth muscles that open and constrict your airways; your short acting rescue inhaler can only provide for a few hours this benefit for a few hours.  Please feel free to continue using your short acting rescue inhaler as often as needed throughout the day for shortness of breath, wheezing, and cough.  All of these medications that I have listed above only work if you use them every day.  Do not skip doses.  If symptoms have not meaningfully improved in the next  5 to 7 days, please follow-up with your primary care provider.  If symptoms have worsened in the next 5 to 7 days, please go to the emergency room for further evaluation.  Thank you for visiting urgent care today.  We appreciate the opportunity to participate in your care.       Disposition Upon Discharge:  Condition: stable for discharge home  Patient presented with an acute illness with associated systemic symptoms and significant discomfort requiring urgent management. In my opinion, this is a condition that a prudent lay person (someone who possesses an average knowledge of health and medicine) may potentially expect to result in complications if not addressed urgently such as respiratory distress, impairment of bodily function or dysfunction of bodily organs.   Routine symptom specific, illness specific and/or disease specific instructions were discussed with the patient and/or caregiver at length.   As such, the patient has been evaluated and assessed, work-up was performed and treatment was provided in alignment with urgent care protocols and evidence based medicine.  Patient/parent/caregiver has been advised that the patient may require follow up for further testing and treatment if the symptoms continue in  spite of treatment, as clinically indicated and appropriate.  Patient/parent/caregiver has been advised to return to the [facility] or PCP if no better; to PCP or the Emergency Department if new signs and symptoms develop, or if the current signs or symptoms continue to change or worsen for further workup, evaluation and treatment as clinically indicated and appropriate  The patient will follow up with their current PCP if and as advised. If the patient does not currently have a PCP we will assist them in obtaining one.   The patient may need specialty follow up if the symptoms continue, in spite of conservative treatment and management, for further workup, evaluation, consultation and  treatment as clinically indicated and appropriate.  Patient/parent/caregiver verbalized understanding and agreement of plan as discussed.  All questions were addressed during visit.  Please see discharge instructions below for further details of plan.  This office note has been dictated using Teaching laboratory technician.  Unfortunately, this method of dictation can sometimes lead to typographical or grammatical errors.  I apologize for your inconvenience in advance if this occurs.  Please do not hesitate to reach out to me if clarification is needed.      Theadora Rama Scales, PA-C 11/10/23 2014

## 2023-12-02 ENCOUNTER — Ambulatory Visit: Payer: No Typology Code available for payment source | Admitting: Internal Medicine

## 2023-12-02 ENCOUNTER — Encounter: Payer: Self-pay | Admitting: Internal Medicine

## 2023-12-02 VITALS — BP 120/80 | HR 78 | Temp 98.4°F | Ht 64.96 in | Wt 192.0 lb

## 2023-12-02 DIAGNOSIS — K219 Gastro-esophageal reflux disease without esophagitis: Secondary | ICD-10-CM

## 2023-12-02 DIAGNOSIS — K760 Fatty (change of) liver, not elsewhere classified: Secondary | ICD-10-CM | POA: Diagnosis not present

## 2023-12-02 DIAGNOSIS — R700 Elevated erythrocyte sedimentation rate: Secondary | ICD-10-CM | POA: Diagnosis not present

## 2023-12-02 DIAGNOSIS — J4530 Mild persistent asthma, uncomplicated: Secondary | ICD-10-CM

## 2023-12-02 DIAGNOSIS — K8020 Calculus of gallbladder without cholecystitis without obstruction: Secondary | ICD-10-CM

## 2023-12-02 DIAGNOSIS — R739 Hyperglycemia, unspecified: Secondary | ICD-10-CM

## 2023-12-02 MED ORDER — FLUTICASONE-SALMETEROL 100-50 MCG/ACT IN AEPB: 1.0000 | INHALATION_SPRAY | Freq: Two times a day (BID) | RESPIRATORY_TRACT | 1 refills | Status: AC

## 2023-12-02 MED ORDER — CETIRIZINE HCL 10 MG PO TABS: 10.0000 mg | ORAL_TABLET | Freq: Every day | ORAL | 2 refills | Status: AC

## 2023-12-02 NOTE — Assessment & Plan Note (Signed)
Needs referral to GI as she is not controlled on pepcid 40 mg daily and prevacid 30 mg daily. She has tried several PPI without relief. Due to insurance she went to [facility] for awhile and was referred there for GI but she does not want to go to [place] for GI. Referral done to [facility] as she does not want to wait long.

## 2023-12-02 NOTE — Patient Instructions (Addendum)
I would recommend to start back cetirizine daily to help the drainage.  We will get you in with GI to check the heart burn/acid.   I have sent in the advair to resume for the breathing.

## 2023-12-02 NOTE — Assessment & Plan Note (Signed)
We discussed that her RUQ and shoulder pain could be gallbladder related. She is unsure and does not want to speak with a surgeon at this time. She wants to see GI and optimize her GERD before considering intervention here.

## 2023-12-02 NOTE — Assessment & Plan Note (Signed)
She is not on any inhaler currently. She states other provider switched her to trelegy but this was expensive and then she was given sample but nothing for several weeks. She was using albuterol 3-4 times a day for preventative and explained that this is not a preventative medication. She should use albuterol only prn for SOB or wheezing. Rx advair which she has been on before. It seems likely that uncontrolled GERD is triggering asthma although she is not able to describe any SOB to me other than at night time to signify true uncontrolled asthma.

## 2023-12-02 NOTE — Assessment & Plan Note (Signed)
Reviewed HgA1c from care everywhere and 5.19 Sep 2023 which is pre-diabetes and will need monitoring every 6 months.

## 2023-12-02 NOTE — Assessment & Plan Note (Signed)
Noted on recent US without elevation in LFTs. We will address at subsequent visit given acuity of other concerns.

## 2023-12-02 NOTE — Assessment & Plan Note (Signed)
Unclear etiology able to review in care everywhere ESR 90 in January and CRP 18. She is having non-specific complaints. ANA was negative. We were unable to talk about any joint complaints today due to the acuity of the GERD and abdominal pain symptoms.

## 2023-12-02 NOTE — Progress Notes (Signed)
   Subjective:   Patient ID: Emma Rhodes, female    DOB: 1966-01-30, 58 y.o.   MRN: [id number]  HPI The patient is a 58 YO female coming in for many concerns. She has changed PCP due to insurance issues since last visit and has had a lot of recent health concerns and problems and testing. She is still having significant GERD issues even after change in medication. Recent labs and US abdomen reviewed. She does have new fatty liver disease and gallstones. At first she denies abdominal pain but then states RUQ pain and right shoulder pain at times. She is having a funny smell in her mouth/nose that she smells all the time. She is unsure why. She has a metallic taste in her mouth as well most of the time. She is unsure if she needs to see rheumatology.   PMH, [facility], social history reviewed and updated  Review of Systems  Constitutional:  Positive for appetite change and fatigue.  HENT:  Positive for congestion, postnasal drip and sore throat. Negative for nosebleeds and trouble swallowing.        Metallic taste in mouth, smell all the time  Eyes: Negative.   Respiratory:  Negative for cough, chest tightness and shortness of breath.   Cardiovascular:  Negative for chest pain, palpitations and leg swelling.  Gastrointestinal:  Positive for abdominal pain. Negative for abdominal distention, constipation, diarrhea, nausea and vomiting.       GERD  Musculoskeletal:  Positive for arthralgias.  Skin: Negative.   Neurological: Negative.   Psychiatric/Behavioral: Negative.      Objective:  Physical Exam Constitutional:      Appearance: She is well-developed.  HENT:     Head: Normocephalic and atraumatic.  Cardiovascular:     Rate and Rhythm: Normal rate and regular rhythm.  Pulmonary:     Effort: Pulmonary effort is normal. No respiratory distress.     Breath sounds: Normal breath sounds. No wheezing or rales.  Abdominal:     General: Bowel sounds are normal. There is no distension.      Palpations: Abdomen is soft.     Tenderness: There is abdominal tenderness. There is no rebound.     Comments: Minimal ruq tenderness  Musculoskeletal:     Cervical back: Normal range of motion.  Skin:    General: Skin is warm and dry.  Neurological:     Mental Status: She is alert and oriented to person, place, and time.     Coordination: Coordination normal.     Vitals:   12/02/23 0826  BP: 120/80  Pulse: 78  Temp: 98.4 F (36.9 C)  TempSrc: Oral  SpO2: 99%  Weight: 192 lb (87.1 kg)  Height: 5' 4.96" (1.65 m)    Assessment & Plan:  Visit time 25 minutes in face to face communication with patient and coordination of care, additional 20 minutes spent in record review, coordination or care, ordering tests, communicating/referring to other healthcare professionals, documenting in medical records all on the same day of the visit for total time 45 minutes spent on the visit.

## 2024-01-30 ENCOUNTER — Telehealth: Payer: Self-pay | Admitting: Internal Medicine

## 2024-01-30 NOTE — Telephone Encounter (Signed)
 Good morning Dr. Elvin Hammer  The following patient is requesting to have an EGD/colonoscopy. She was recently seen in February 2025 with [facility]. She does not like the care she received and wants to get back with a [facility] doctor. Records are available with care everywhere. Please review and advise of scheduling. Thank you.

## 2024-01-30 NOTE — Telephone Encounter (Signed)
Request received to transfer GI care from outside practice to [facility].  We appreciate the interest in our practice, however at this time due to high demand from patients without established GI providers we cannot accommodate this transfer.  Ability to accommodate future transfer requests may change over time and the patient can contact us again in 6-12 months if still interested in being seen at [facility].      °

## 2024-07-17 ENCOUNTER — Ambulatory Visit: Admitting: Internal Medicine

## 2024-07-17 ENCOUNTER — Ambulatory Visit: Payer: Self-pay

## 2024-07-17 NOTE — Telephone Encounter (Signed)
 FYI Only or Action Required?: FYI only for provider.  Patient was last seen in primary care on 12/02/2023 by Rollene Almarie LABOR, MD.  Called Nurse Triage reporting Abdominal Pain.  Symptoms began 2-3 days ago.  Interventions attempted: OTC medications: Tylenol  and an antacid.  Symptoms are: unchanged.  Triage Disposition: See Physician Within 24 Hours  Patient/caregiver understands and will follow disposition?: Yes     Copied from CRM #[id number]. Topic: Clinical - Red Word Triage >> Jul 17, 2024  2:42 PM Precious C wrote: [facility] that prompted transfer to Nurse Triage: SEVERE PAIN   Patient called stating she is experiencing pain and discomfort in her upper stomach/upper abdominal area. She also reports constipation, sore throat, eye and ear pain, and other symptoms she would like to discuss.      Reason for Disposition  [1] MODERATE pain (e.g., interferes with normal activities) AND [2] comes and goes (cramps) AND [3] present > 24 hours  (Exception: Pain with Vomiting or Diarrhea - see that Guideline.)  Answer Assessment - Initial Assessment Questions 1. LOCATION: Where does it hurt?      Upper abdomen  2. RADIATION: Does the pain shoot anywhere else? (e.g., chest, back)     Some radiation to back and to the top of both arm, but states she lifts and is unsure if it is related  3. ONSET: When did the pain begin? (e.g., minutes, hours or days ago)      2-3 days ago  4. SUDDEN: Gradual or sudden onset?     Gradual  5. PATTERN Does the pain come and go, or is it constant?     Intermittent  6. SEVERITY: How bad is the pain?  (e.g., Scale 1-10; mild, moderate, or severe)     5/10 7. RECURRENT SYMPTOM: Have you ever had this type of stomach pain before? If Yes, ask: When was the last time? and What happened that time?      History of acid reflux  8. AGGRAVATING FACTORS: Does anything seem to cause this pain? (e.g., foods, stress, alcohol)     Foods  9.  CARDIAC SYMPTOMS: Do you have any of the following symptoms: chest pain, difficulty breathing, sweating, nausea?     No 10. OTHER SYMPTOMS: Do you have any other symptoms? (e.g., back pain, diarrhea, fever, urination pain, vomiting)       Constipation, sneezing a lot, intermittent cough, itchy eyes, bilateral ear discomfort  Protocols used: Abdominal Pain - Upper-A-AH

## 2024-08-01 ENCOUNTER — Encounter (HOSPITAL_COMMUNITY): Payer: Self-pay

## 2024-08-01 ENCOUNTER — Ambulatory Visit (HOSPITAL_COMMUNITY)
Admission: EM | Admit: 2024-08-01 | Discharge: 2024-08-01 | Disposition: A | Discharge status: Home / Self Care | Attending: Family Medicine | Admitting: Family Medicine

## 2024-08-01 DIAGNOSIS — N3090 Cystitis, unspecified without hematuria: Secondary | ICD-10-CM | POA: Insufficient documentation

## 2024-08-01 LAB — POCT URINALYSIS DIP (MANUAL ENTRY)
Bilirubin, UA: NEGATIVE
Glucose, UA: NEGATIVE mg/dL
Ketones, POC UA: NEGATIVE mg/dL
Nitrite, UA: NEGATIVE
Protein Ur, POC: 30 mg/dL — AB
Spec Grav, UA: 1.01 (ref 1.010–1.025)
Urobilinogen, UA: 0.2 U/dL
pH, UA: 6 (ref 5.0–8.0)

## 2024-08-01 MED ORDER — CEPHALEXIN 500 MG PO CAPS: 500.0000 mg | ORAL_CAPSULE | Freq: Two times a day (BID) | ORAL | 0 refills | Status: AC

## 2024-08-01 NOTE — Discharge Instructions (Addendum)
 You have had labs (urine culture) sent today. We will call you with any significant abnormalities or if there is need to begin or change treatment or pursue further follow up.  You may also review your test results online through MyChart. If you do not have a MyChart account, instructions to sign up should be on your discharge paperwork.

## 2024-08-01 NOTE — ED Triage Notes (Signed)
 Pt c/o bladder pressure x2 days d/t urine retention. States appt with Urology tomorrow.

## 2024-08-02 LAB — URINE CULTURE: Culture: NO GROWTH

## 2024-08-02 NOTE — ED Provider Notes (Signed)
 [facility]    ASSESSMENT & PLAN:  1. Cystitis    Begin: Meds ordered this encounter  Medications   cephALEXin  (KEFLEX ) 500 MG capsule    Sig: Take 1 capsule (500 mg total) by mouth 2 (two) times daily.    Dispense:  10 capsule    Refill:  0   Labs Reviewed  POCT URINALYSIS DIP (MANUAL ENTRY) - Abnormal; Notable for the following components:      Result Value   Clarity, UA cloudy (*)    Blood, UA large (*)    Protein Ur, POC =30 (*)    Leukocytes, UA Large (3+) (*)    All other components within normal limits  URINE CULTURE    No signs of pyelonephritis. Urine culture sent. Will notify patient of any significant results. Ensure proper hydration. Will follow up with her PCP or here if not showing improvement over the next 48 hours, sooner if needed.  Outlined signs and symptoms indicating need for more acute intervention. Patient verbalized understanding. After Visit Summary given.  SUBJECTIVE:  Emma Rhodes is a 58 y.o. female who complains of bladder pressure x2 days d/t urine retention. States appt with Urology tomorrow.  H/O UTI. LMP: No LMP recorded. Patient is perimenopausal.  OBJECTIVE:  Vitals:   08/01/24 2007  BP: (!) 147/93  Pulse: 87  Resp: 18  Temp: 98.2 F (36.8 C)  TempSrc: Oral  SpO2: 97%   General appearance: alert; no distress Abdomen: soft Back: no CVA tenderness Extremities: no edema; symmetrical with no gross deformities Skin: warm and dry Psychological: alert and cooperative; normal mood and affect  Labs Reviewed  POCT URINALYSIS DIP (MANUAL ENTRY) - Abnormal; Notable for the following components:      Result Value   Clarity, UA cloudy (*)    Blood, UA large (*)    Protein Ur, POC =30 (*)    Leukocytes, UA Large (3+) (*)    All other components within normal limits  URINE CULTURE    No Known Allergies  Past Medical History:  Diagnosis Date   Asthma    Asthma    GERD (gastroesophageal reflux disease)     HPV (human papilloma virus) infection    HSV infection    Low iron    Measles    Mumps    Ovarian cyst    Vaginitis    Varicose veins    Social History   Socioeconomic History   Marital status: Divorced    Spouse name: Not on file   Number of children: Not on file   Years of education: Not on file   Highest education level: Not on file  Occupational History   Not on file  Tobacco Use   Smoking status: Never   Smokeless tobacco: Never  Substance and Sexual Activity   Alcohol use: No   Drug use: No   Sexual activity: Yes    Birth control/protection: None  Other Topics Concern   Not on file  Social History Narrative   Not on file   Social Drivers of Health   Financial Resource Strain: Not on file  Food Insecurity: Low Risk  (04/04/2024)   Received from [facility]   Hunger Vital Sign    Within the past 12 months, you worried that your food would run out before you got money to buy more: Never true    Within the past 12 months, the food you bought just didn't last and you didn't have money  to get more. : Never true  Transportation Needs: No Transportation Needs (04/04/2024)   Received from [facility]   Transportation    In the past 12 months, has lack of reliable transportation kept you from medical appointments, meetings, work or from getting things needed for daily living? : No  Physical Activity: Not on file  Stress: Not on file  Social Connections: Unknown (02/13/2022)   Received from [facility]   Social Network    Social Network: Not on file  Intimate Partner Violence: Unknown (01/11/2022)   Received from [facility]   HITS    Physically Hurt: Not on file    Insult or Talk Down To: Not on file    Threaten Physical Harm: Not on file    Scream or Curse: Not on file   Family History  Problem Relation Age of Onset   Dementia Mother         Rolinda Rogue, MD 08/02/24 [phone]

## 2024-08-03 ENCOUNTER — Ambulatory Visit (HOSPITAL_COMMUNITY): Payer: Self-pay

## 2024-08-06 ENCOUNTER — Ambulatory Visit: Admitting: Internal Medicine

## 2024-08-15 ENCOUNTER — Other Ambulatory Visit: Payer: Self-pay | Admitting: Obstetrics and Gynecology

## 2024-08-15 DIAGNOSIS — Z1231 Encounter for screening mammogram for malignant neoplasm of breast: Secondary | ICD-10-CM

## 2024-08-30 ENCOUNTER — Ambulatory Visit: Payer: Self-pay

## 2024-08-30 NOTE — Telephone Encounter (Signed)
 FYI Only or Action Required?: FYI only for provider: appointment scheduled on 08/31/2024.  Patient was last seen in primary care on 12/02/2023 by Rollene Almarie LABOR, MD.  Called Nurse Triage reporting Abdominal Pain.  Symptoms began several weeks ago.  Interventions attempted: Dietary changes.  Symptoms are: gradually worsening.  Triage Disposition: See Physician Within 24 Hours  Patient/caregiver understands and will follow disposition?: Yes        Copied from CRM [id number]. Topic: Clinical - Red Word Triage >> Aug 30, 2024  4:11 PM Winona R wrote: Stomach pain sneezing, coughing and nasal draining Reason for Disposition  [1] MODERATE pain (e.g., interferes with normal activities) AND [2] pain comes and goes (cramps) AND [3] present > 24 hours  (Exception: Pain with Vomiting or Diarrhea - see that Guideline.)  Answer Assessment - Initial Assessment Questions 1. LOCATION: Where does it hurt?      Lower abd area  2. RADIATION: Does the pain shoot anywhere else? (e.g., chest, back)     Yes to her back  3. ONSET: When did the pain begin? (e.g., minutes, hours or days ago)      2 weeks ago  4. PATTERN Does the pain come and go, or is it constant?     Comes and goes  5. SEVERITY: How bad is the pain?  (e.g., Scale 1-10; mild, moderate, or severe)     7-8/10  6. CAUSE: What do you think is causing the stomach pain? (e.g., gallstones, recent abdominal surgery)     Unsure  7. RELIEVING/AGGRAVATING FACTORS: What makes it better or worse? (e.g., antacids, bending or twisting motion, bowel movement)     Bowel movement  8. OTHER SYMPTOMS: Do you have any other symptoms? (e.g., back pain, diarrhea, fever, urination pain, vomiting)       Constipation last BM was today, cough, and nasal drainage  Protocols used: Abdominal Pain - [facility]

## 2024-08-31 ENCOUNTER — Ambulatory Visit (INDEPENDENT_AMBULATORY_CARE_PROVIDER_SITE_OTHER): Admitting: Family Medicine

## 2024-08-31 VITALS — BP 118/78 | HR 71 | Temp 98.4°F | Resp 16 | Ht 64.96 in | Wt 187.6 lb

## 2024-08-31 DIAGNOSIS — R7989 Other specified abnormal findings of blood chemistry: Secondary | ICD-10-CM | POA: Diagnosis not present

## 2024-08-31 DIAGNOSIS — R1084 Generalized abdominal pain: Secondary | ICD-10-CM | POA: Diagnosis not present

## 2024-08-31 DIAGNOSIS — K5900 Constipation, unspecified: Secondary | ICD-10-CM

## 2024-08-31 DIAGNOSIS — K219 Gastro-esophageal reflux disease without esophagitis: Secondary | ICD-10-CM

## 2024-08-31 MED ORDER — PANTOPRAZOLE SODIUM 40 MG PO TBEC: 40.0000 mg | DELAYED_RELEASE_TABLET | Freq: Every day | ORAL | 0 refills | Status: AC

## 2024-08-31 NOTE — Patient Instructions (Addendum)
 A few things to remember from today's visit:  Constipation, unspecified constipation type - Plan: TSH  Generalized abdominal pain - Plan: Comprehensive metabolic panel with GFR  Abnormal TSH - Plan: TSH, T4, free  Add Senakot to Miralax and take both at bedtime. Pantoprazole  30 min before a meal, breakfast. Please arrange appt with Dr Rollene for 2 weeks follow up.  If you need refills for medications you take chronically, please call your pharmacy. Do not use My Chart to request refills or for acute issues that need immediate attention. If you send a my chart message, it may take a few days to be addressed, specially if I am not in the office.  Please be sure medication list is accurate. If a new problem present, please set up appointment sooner than planned today.

## 2024-08-31 NOTE — Progress Notes (Signed)
 ACUTE VISIT Chief Complaint  Patient presents with   Abdominal Pain    Constipation over month. 2 Bowel Movement per month. Hard stool and hard to pass.   HPI: Ms.Emma Rhodes is a 58 y.o. female with past medical history significant for asthma, GERD, anxiety/depression, and arthralgias who is here today complaining of *** HPI Discussed the use of AI scribe software for clinical note transcription with the patient, who gave verbal consent to proceed.  History of Present Illness Emma Rhodes is a 58 year old female who presents with abdominal pain and constipation.  She has been experiencing abdominal pain and constipation for over a month. The abdominal pain began approximately ten days ago and is described as crampy and aching, occurring intermittently. The pain is located in the stomach area and sometimes improves slightly after a bowel movement or passing gas. No blood in the stool, but the stool can be dark at times. She has been using Miralax to manage constipation and had her last bowel movement yesterday, which was hard. Despite this, she often feels the need to have another bowel movement but is unable to do so.  She has a history of bladder retention and visited urgent care three weeks ago due to severe pain, where she was prescribed antibiotics, although it was not a bladder infection. She also experiences significant acid reflux and has tried multiple medications including famotidine, omeprazole , and lansoprazole, but none have provided relief. She is not currently taking lansoprazole as it was ineffective. She has not tried pantoprazole  or esomeprazole .  In the review of symptoms, she reports chills but no fever. She also mentions having a lot of mucus and ear pain. Her thyroid  function was abnormal in March, and she has been taking biotin.   Lab Results  Component Value Date   TSH 6.93 (H) 12/27/2022   Hair Loss x 2-3 months ***  Review of Systems See other pertinent  positives and negatives in HPI.  Current Outpatient Medications on File Prior to Visit  Medication Sig Dispense Refill   albuterol  (VENTOLIN  HFA) 108 (90 Base) MCG/ACT inhaler TAKE 2 PUFFS BY MOUTH EVERY 6 HOURS AS NEEDED FOR WHEEZE OR SHORTNESS OF BREATH 8.5 each 2   cetirizine  (ZYRTEC  ALLERGY) 10 MG tablet Take 1 tablet (10 mg total) by mouth daily. 30 tablet 2   citalopram  (CELEXA ) 20 MG tablet TAKE 1 TABLET BY MOUTH EVERY DAY 30 tablet 11   famotidine (PEPCID) 40 MG tablet Take by mouth.     fluticasone  (FLONASE ) 50 MCG/ACT nasal spray Place 2 sprays into both nostrils daily. 9.9 mL 2   ondansetron  (ZOFRAN -ODT) 4 MG disintegrating tablet Take 1 tablet (4 mg total) by mouth every 6 (six) hours as needed for nausea or vomiting. 20 tablet 0   YUVAFEM  10 MCG TABS vaginal tablet Place 1 tablet vaginally 2 (two) times a week.     cephALEXin  (KEFLEX ) 500 MG capsule Take 1 capsule (500 mg total) by mouth 2 (two) times daily. (Patient not taking: Reported on 08/31/2024) 10 capsule 0   cyclobenzaprine  (FLEXERIL ) 5 MG tablet TAKE 1 TABLET BY MOUTH 2 TIMES DAILY AS NEEDED FOR MUSCLE SPASMS. (Patient not taking: Reported on 08/31/2024) 60 tablet 5   fluticasone -salmeterol (ADVAIR) 100-50 MCG/ACT AEPB Inhale 1 puff into the lungs 2 (two) times daily. (Patient not taking: Reported on 08/31/2024) 60 each 1   trimethoprim (TRIMPEX) 100 MG tablet Take 100 mg by mouth daily.     No current facility-administered  medications on file prior to visit.    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    Low iron    Measles    Mumps    Ovarian cyst    Vaginitis    Varicose veins    No Known Allergies  Social History   Socioeconomic History   Marital status: Divorced    Spouse name: Not on file   Number of children: Not on file   Years of education: Not on file   Highest education level:  Not on file  Occupational History   Not on file  Tobacco Use   Smoking status: Never   Smokeless tobacco: Never  Substance and Sexual Activity   Alcohol use: No   Drug use: No   Sexual activity: Yes    Birth control/protection: None  Other Topics Concern   Not on file  Social History Narrative   Not on file   Social Drivers of Health   Financial Resource Strain: Not on file  Food Insecurity: Low Risk  (04/04/2024)   Received from [facility]   Hunger Vital Sign    Within the past 12 months, the food you bought just didn't last and you didn't have money to get more. : Never true    Within the past 12 months, you worried that your food would run out before you got money to buy more: Never true  Transportation Needs: No Transportation Needs (04/04/2024)   Received from [facility]    In the past 12 months, has lack of reliable transportation kept you from medical appointments, meetings, work or from getting things needed for daily living? : No  Physical Activity: Not on file  Stress: Not on file  Social Connections: Not on file    Vitals:   08/31/24 1542  BP: 118/78  Pulse: 71  Temp: 98.4 F (36.9 C)  SpO2: 98%   Body mass index is 31.26 kg/m.  Physical Exam  ASSESSMENT AND PLAN: Constipation, unspecified constipation type -     TSH; Future  Generalized abdominal pain -     Comprehensive metabolic panel with GFR; Future  Abnormal TSH -     TSH; Future -     T4, free; Future  Other orders -     Pantoprazole  Sodium; Take 1 tablet (40 mg total) by mouth daily before breakfast.  Dispense: 60 tablet; Refill: 0    No follow-ups on file.  Kamareon Sciandra G. Rufina Kimery, MD  [facility]. [place] office.

## 2024-08-31 NOTE — Telephone Encounter (Signed)
 Pt has acute visit scheduled for today.

## 2024-09-01 ENCOUNTER — Ambulatory Visit: Payer: Self-pay | Admitting: Family Medicine

## 2024-09-01 ENCOUNTER — Encounter: Payer: Self-pay | Admitting: Family Medicine

## 2024-09-01 LAB — COMPREHENSIVE METABOLIC PANEL WITH GFR
ALT: 19 IU/L (ref 0–32)
AST: 20 IU/L (ref 0–40)
Albumin: 3.9 g/dL (ref 3.8–4.9)
Alkaline Phosphatase: 117 IU/L (ref 49–135)
BUN/Creatinine Ratio: 13 (ref 9–23)
BUN: 13 mg/dL (ref 6–24)
Bilirubin Total: 0.3 mg/dL (ref 0.0–1.2)
CO2: 23 mmol/L (ref 20–29)
Calcium: 10.1 mg/dL (ref 8.7–10.2)
Chloride: 100 mmol/L (ref 96–106)
Creatinine, Ser: 0.98 mg/dL (ref 0.57–1.00)
Globulin, Total: 3.4 g/dL (ref 1.5–4.5)
Glucose: 95 mg/dL (ref 70–99)
Potassium: 4.6 mmol/L (ref 3.5–5.2)
Sodium: 138 mmol/L (ref 134–144)
Total Protein: 7.3 g/dL (ref 6.0–8.5)
eGFR: 67 mL/min/1.73 (ref >59)

## 2024-09-01 LAB — CBC
Hematocrit: 42 % (ref 34.0–46.6)
Hemoglobin: 13.3 g/dL (ref 11.1–15.9)
MCH: 26.5 pg — ABNORMAL LOW (ref 26.6–33.0)
MCHC: 31.7 g/dL (ref 31.5–35.7)
MCV: 84 fL (ref 79–97)
Platelets: 330 x10E3/uL (ref 150–450)
RBC: 5.01 x10E6/uL (ref 3.77–5.28)
RDW: 14.3 % (ref 11.7–15.4)
WBC: 8.7 x10E3/uL (ref 3.4–10.8)

## 2024-09-01 LAB — TSH: TSH: 1.74 u[IU]/mL (ref 0.450–4.500)

## 2024-09-01 LAB — T4, FREE: Free T4: 0.92 ng/dL (ref 0.82–1.77)

## 2024-09-13 ENCOUNTER — Telehealth: Payer: Self-pay

## 2024-09-13 NOTE — Telephone Encounter (Signed)
 Copied from CRM [id number]. Topic: Clinical - Medical Advice >> Sep 13, 2024  4:22 PM Melissa C wrote: Reason for CRM: patient stated she had testing and they wanted her to follow up with her physician. Patient only wanted afternoon appointments and could not get an afternoon appointment with physician for several weeks. Patient was wondering if she could speak with a nurse regarding her care or if she needs to make an appointment. Patient can only be reached around 4pm at [phone] and has a scam blocker on phone so if you are unable to initially reach her she asked that you please leave her a message and she will call you back.

## 2024-09-17 NOTE — Telephone Encounter (Signed)
 Called around 4pm and LVM to call office.

## 2024-09-24 ENCOUNTER — Ambulatory Visit: Admitting: Internal Medicine
